# Patient Record
Sex: Male | Born: 1935 | Race: White | Hispanic: No | Marital: Married | Smoking: Former smoker
Health system: Southern US, Community
[De-identification: ages and names within clinical notes are randomized; demographics above are authoritative.]

## PROBLEM LIST (undated history)

## (undated) DIAGNOSIS — K279 Peptic ulcer, site unspecified, unspecified as acute or chronic, without hemorrhage or perforation: Secondary | ICD-10-CM

## (undated) DIAGNOSIS — R911 Solitary pulmonary nodule: Secondary | ICD-10-CM

## (undated) DIAGNOSIS — N529 Male erectile dysfunction, unspecified: Secondary | ICD-10-CM

## (undated) DIAGNOSIS — I2510 Atherosclerotic heart disease of native coronary artery without angina pectoris: Secondary | ICD-10-CM

## (undated) DIAGNOSIS — Z87438 Personal history of other diseases of male genital organs: Secondary | ICD-10-CM

## (undated) DIAGNOSIS — N189 Chronic kidney disease, unspecified: Secondary | ICD-10-CM

## (undated) DIAGNOSIS — I10 Essential (primary) hypertension: Secondary | ICD-10-CM

## (undated) DIAGNOSIS — K219 Gastro-esophageal reflux disease without esophagitis: Secondary | ICD-10-CM

## (undated) DIAGNOSIS — E785 Hyperlipidemia, unspecified: Secondary | ICD-10-CM

## (undated) DIAGNOSIS — I341 Nonrheumatic mitral (valve) prolapse: Secondary | ICD-10-CM

## (undated) DIAGNOSIS — J189 Pneumonia, unspecified organism: Secondary | ICD-10-CM

## (undated) HISTORY — DX: Hyperlipidemia, unspecified: E78.5

## (undated) HISTORY — DX: Atherosclerotic heart disease of native coronary artery without angina pectoris: I25.10

## (undated) HISTORY — DX: Gastro-esophageal reflux disease without esophagitis: K21.9

## (undated) HISTORY — PX: COLONOSCOPY: SHX174

## (undated) HISTORY — DX: Nonrheumatic mitral (valve) prolapse: I34.1

## (undated) HISTORY — DX: Peptic ulcer, site unspecified, unspecified as acute or chronic, without hemorrhage or perforation: K27.9

## (undated) HISTORY — DX: Chronic kidney disease, unspecified: N18.9

## (undated) HISTORY — DX: Male erectile dysfunction, unspecified: N52.9

## (undated) HISTORY — DX: Personal history of other diseases of male genital organs: Z87.438

## (undated) HISTORY — PX: HIP SURGERY: SHX245

## (undated) HISTORY — PX: CORONARY ANGIOPLASTY WITH STENT PLACEMENT: SHX49

## (undated) HISTORY — DX: Solitary pulmonary nodule: R91.1

## (undated) HISTORY — PX: TONSILLECTOMY: SUR1361

---

## 1998-04-28 ENCOUNTER — Other Ambulatory Visit: Admission: RE | Admit: 1998-04-28 | Discharge: 1998-04-28 | Payer: Self-pay | Admitting: Gastroenterology

## 1998-05-18 ENCOUNTER — Ambulatory Visit (HOSPITAL_COMMUNITY): Admission: RE | Admit: 1998-05-18 | Discharge: 1998-05-18 | Payer: Self-pay | Admitting: Gastroenterology

## 2001-06-18 ENCOUNTER — Ambulatory Visit (HOSPITAL_COMMUNITY): Admission: RE | Admit: 2001-06-18 | Discharge: 2001-06-19 | Payer: Self-pay | Admitting: Cardiology

## 2001-07-02 ENCOUNTER — Ambulatory Visit (HOSPITAL_COMMUNITY): Admission: RE | Admit: 2001-07-02 | Discharge: 2001-07-03 | Payer: Self-pay | Admitting: Interventional Cardiology

## 2003-05-25 ENCOUNTER — Ambulatory Visit (HOSPITAL_COMMUNITY): Admission: RE | Admit: 2003-05-25 | Discharge: 2003-05-25 | Payer: Self-pay | Admitting: Gastroenterology

## 2006-12-08 ENCOUNTER — Inpatient Hospital Stay (HOSPITAL_COMMUNITY): Admission: AC | Admit: 2006-12-08 | Discharge: 2006-12-12 | Payer: Self-pay

## 2007-12-22 ENCOUNTER — Encounter: Admission: RE | Admit: 2007-12-22 | Discharge: 2007-12-22 | Payer: Self-pay | Admitting: Family Medicine

## 2008-12-15 ENCOUNTER — Encounter: Admission: RE | Admit: 2008-12-15 | Discharge: 2008-12-15 | Payer: Self-pay | Admitting: Family Medicine

## 2010-05-14 ENCOUNTER — Encounter: Payer: Self-pay | Admitting: Family Medicine

## 2010-09-04 NOTE — Op Note (Signed)
NAMESUHEYB, RAUCCI                 ACCOUNT NO.:  [id number]   MEDICAL RECORD NO.:  [id number]          PATIENT TYPE:  INP   LOCATION:  2550                         FACILITY:  [facility]   PHYSICIAN:  Feliberto Gottron. Turner Daniels, M.D.   DATE OF BIRTH:  15-Jun-1933   DATE OF PROCEDURE:  12/08/2006  DATE OF DISCHARGE:                               OPERATIVE REPORT   PREOPERATIVE DIAGNOSIS:  Traumatic left femoral neck fracture.   POSTOPERATIVE DIAGNOSIS:  Traumatic left femoral neck fracture.   PROCEDURE:  Left hemi-hip arthroplasty using a DePuy #5 Summit basic  stem cemented, 14 tip, #4 cement restricter, NK +0 51 mm monopolar ball.   SURGEON:  Feliberto Gottron. Turner Daniels, M.D.   FIRST ASSISTANT:  Skip Mayer PA-C.   ANESTHETIC:  General endotracheal anesthesia.   ESTIMATED BLOOD LOSS:  300 mL.   FLUID REPLACEMENT:  One Liter crystalloid.   DRAINS PLACED:  None.   TOURNIQUET TIME:  None.   INDICATIONS FOR PROCEDURE:  A 77 year old man who was trimming some  three limbs and fell about 8 feet vertical off of a ladder and sustained  a left femoral neck fracture. When EMS came out to transport him, he was  noted to be hypotensive, gold trauma was activated and he was seen in  the ER by the trauma service and worked up to make sure there were no  other injuries besides left femoral neck fracture and there were not.  Orthopedics was consulted for treatment and we saw him in the emergency  room he did, in fact, have a traumatic left femoral neck fracture.  Risks and benefits of hemi-hip arthroplasty were discussed with this  patient and surgery was consented to.   DESCRIPTION OF PROCEDURE:  The patient identified by armband, taken to  the operating room at [facility]. [facility]  Appropriate  anesthetic monitors were attached and general endotracheal anesthesia  induced with the patient in the supine position.  A Foley catheter was  inserted.  He received 2 grams of Ancef preoperatively, was then  rolled  into the right lateral decubitus position, fixed there with a Stulberg  Mark II pelvic clamp and then the left lower extremity prepped and  draped in the usual sterile fashion from the ankle to the hemi-pelvis.  The skin along the lateral hip and thigh was infiltrated with 20 mL of  0.50% Marcaine and epinephrine solution and a 15 cm incision centered  over the greater trochanter was made allowing a posterolateral approach  to the hip joint.  Small bleeders in the skin and subcutaneous tissue  identified and cauterized.  The IT band was cut in line with the skin  incision exposing fracture hematoma.  A Cobra retractor was placed  between the gluteus minimus and the superior hip joint capsule and a  second Cobra between the quadratus femoris and the inferior hip joint  capsule.  The piriformis and short external rotators were identified and  cut off their insertion on the intertrochanteric crest.  The posterior  capsule was developed into an acetabular based flap and likewise tagged  with two #2 Ethibond sutures.  This exposed the femoral neck fracture.  The femur was flexed and internally rotated exposing the femoral neck  and a standard neck cut was performed one fingerbreadth above the lesser  trochanter.  This allowed extraction of the femoral head with a  corkscrew.  It was measured 51 mm and trial 50 and 51 mm balls were  inserted into the acetabulum.  The 51 had the best fit, fill and  suction.  At this point, the wound was irrigated out with normal saline  solution.  The hip was once again flexed and internally rotated,  exposing the proximal femur which was entered with the initiating  reamer, followed by the lateralizer and then broached up to a #5 broach.  Satisfied with the fit of the broach.  We then milled the calcar down to  a standard fingerbreadth above the lesser trochanter and performed a  trial reduction was an +0 51 mm trial head.  The hip was reduced, could   not be dislocated anteriorly in flexion, it could be internally rotated  almost 80 degrees before there was any instability noted.  At this  point, the trial components were removed.  We sized for a #4 femoral  cement restricter which was inserted at the appropriate depth.  The  proximal femur was then pulse lavaged, dried with suction and sponges  and then a double batch of DePuy 1 cement was mixed with 1500 mg of  Zinacef and inserted in the proximal femur under pressure followed by a  #5 Summit basic stem with a 14 mm tip.  Excess cement was removed as the  cement was allowed to cure and the stem was held in 15-20 degrees of  anteversion.  At this point, an NK +0 51 mm monopolar head was hammered  onto the stem.  The hip again reduced and stability noted to be  excellent.  The wound was once again irrigated out with normal saline  solution.  The capsular flaps and short external rotators were repaired  back to the intertrochanteric crest through drill holes.  The IT band  was closed with running #1 Vicryl suture, the subcutaneous tissue with 0  and 2-0 undyed Vicryl suture and the skin with running interlocking 3-0  nylon suture.  Mepilex was then applied.  The patient was unclamped,  rolled supine, awakened and taken to the recovery room without  difficulty.      Feliberto Gottron. Turner Daniels, M.D.  Electronically Signed     FJR/MEDQ  D:  12/08/2006  T:  12/09/2006  Job:  [id number]

## 2010-09-07 NOTE — Cardiovascular Report (Signed)
[place]  Patient:    Cory Conley, Cory Conley Visit Number: [id number] MRN: [id number]          Service Type: CAT Location: 6500 [id number] [id number] Attending Physician:  Armanda Magic Dictated by:   Darci Needle, M.D. Proc. Date: 06/18/01 Admit Date:  06/18/2001   CC:         Desma Maxim, M.D.  Armanda Magic, M.D.   Cardiac Catheterization  INDICATION FOR PROCEDURE: Subtotally occluded LAD  with abnormal Cardiolite suggesting apical ischemia.  DESCRIPTION OF PROCEDURE: After informed consent, the 6 French sheath previously placed by Dr. Mayford Knife was exchanged using the double glove technique for a 7 French sheath. We then gave 4000 units of IV heparin. A double bolus followed by an infusion of Integrilin was started, and angioplasty on the LAD followed by stenting was performed. This was a quite difficult procedure because of the superior origin of the left main from the left sinus of Valsalva. It also is apparent that the LAD segment in which we were working is perhaps intramyocardial and involves the bifurcation of the intramyocardial LAD with a large septal perforator. We subsequently were successful with a 3.5 cm tip, 7 [place], left Judkins guide catheter. I also needed to use a luge wire rather than a BMW wire.  After successfully crossing the stenosis in the LAD, angioplasty was performed with a CrossSail 15 mm 2.25 mm diameter balloon. After initial angioplasty, we attempted to deploy a 23 mm, long 2.25 mm diameter Pixel, but it would not course into the full segment of the LAD. We then redilated more distally in the LAD, attempting to achieve better guide support, got deeper wire position in the distal LAD, and we were able to deploy the stent to 10 atmospheres, which is 2.3 mm in diameter.  We then post-dilated with a 15 mm, long, 2.5 mm diameter CrossSail to 10 atmospheres, which is 2.56 mm diameter. The stent did not include the ostium of  the LAD beyond the large septal perforator. The patient tolerated the procedure without any significant symptoms suggesting that there are well formed collaterals to the distal LAD bed. Because of a total of 320 cc of contrast used during the LAD procedure, we chose to stage the intervention on the right coronary for another date.  ACT postprocedure 314 seconds.  TIMI grade 3 flow was noted in the LAD after the procedure. Sheaths were sewn in place.  CONCLUSIONS: Successful percutaneous transluminal coronary angioplasty and stent of the proximal (probably intramyocardial left anterior descending with reduction in stenosis from 99% to 0% with an effective diameter of 2.56 mm).  PLAN: Aspirin and Plavix. Integrilin x18 hours. PTCA/stent of the RCA early next week. Dictated by:   Darci Needle, M.D. Attending Physician:  Armanda Magic DD:  06/18/01 TD:  06/18/01 Job: [id number]

## 2010-09-07 NOTE — Cardiovascular Report (Signed)
[place]. [facility]  Patient:    Cory Conley, Cory Conley Visit Number: [id number] MRN: [id number]          Service Type: CAT Location: 6500 6525 01 Attending Physician:  Lyn Records. Iii Dictated by:   Darci Needle, M.D. Proc. Date: 07/02/01 Admit Date:  07/02/2001   CC:         Armanda Magic, M.D.  Desma Maxim, M.D.   Cardiac Catheterization  INDICATION FOR PROCEDURE: High-grade mid right coronary stenosis beyond shepherds crook.  PROCEDURES PERFORMED: 1. Left heart catheterization. 2. Left coronary angiography. 3. Stent, right coronary artery.  DESCRIPTION OF PROCEDURE: After informed consent, a 6 French sheath was placed in the right femoral artery using the modified Seldinger technique.  A 6 French #4 left Judkins catheter was used for left coronary angiography. After documenting wide patency of the LAD stent, we turned our attention to the right coronary. We removed the diagnostic left Judkins catheter and obtained guiding shots initially with a side-hole, hockey-stick, 6 Jamaica guide catheter. Despite the appearance that it might provide good support, this guide catheter would only partially enter the horizontal segment of the proximal right coronary and therefore there was not good guide support. We subsequently switched to a 6 Jamaica, shepherds crook configuration guide catheter which provided much better coaxial support. The BMW wire was passed beyond the lesion into the distal right coronary and we attempted to directly stent the lesion. However, the stent would not traverse around the shepherds crook without backing out the guide catheter. We then placed a second BMW wire which further straightened the shepherds crook and made it easy for the stent to be positioned distal to the shepherds crook. Two balloon inflations were performed with a 3.0 mm diameter, 13 mm long, Express II stent. Affective diameter 3.4 mm.  The patient  tolerated the procedure. He had angina with each episode of balloon inflations. Final angiographic results were adequate. TIMI grade 3 flow was present postprocedure. ACT was 260 seconds.  CONCLUSIONS: 1. Continued wide patency of the stent in the left anterior descending. 2. Successful direct stent in the right coronary beyond the shepherds crook    in the proximal vessel with reduction in stenosis from 80% to 0%.  PLAN: Aspirin, Plavix, Integrilin. Discharge July 03, 2001. Dictated by:   Darci Needle, M.D. Attending Physician:  Lyn Records. Iii DD:  07/02/01 TD:  07/03/01 Job: [id number]

## 2010-09-07 NOTE — Cardiovascular Report (Signed)
[place]  Patient:    Cory Conley, Cory Conley Visit Number: [id number] MRN: [id number]          Service Type: CAT Location: [facility] 2854 01 Attending Physician:  Armanda Magic Dictated by:   Armanda Magic, M.D. Proc. Date: 06/18/01 Admit Date:  06/18/2001   CC:         Desma Maxim, M.D.   Cardiac Catheterization  REFERRING PHYSICIAN:  Desma Maxim, M.D.  PROCEDURE:  Left heart catheterization, coronary angiography, left ventriculography.  OPERATOR:  Armanda Magic, M.D.  INDICATIONS:  Chest pain and abnormal cardiolite.  COMPLICATIONS:  None.  IV ACCESS:  Via the right femoral artery with a 6-French sheath.  HISTORY OF PRESENT ILLNESS:  This is a 77 year old white male who presented with substernal chest pain and was found to have an abnormal cardiolite with an apical defect.  The patient now presents for cardiac catheterization.  DESCRIPTION OF PROCEDURE:  The patient was brought to the cardiac catheterization laboratory in the fasting nonsedated state.  Informed consent was obtained.  The patient was connected to continuous heart rate and pulse oximetry monitoring and intermittent blood pressure monitoring.  The right groin was prepped and draped in the sterile fashion.  One percent Xylocaine was used for local anesthesia.  Using the modified Seldinger technique, a 6-French sheath was placed in the right femoral artery.  Under fluoroscopic guidance, a 6-French JL-4 catheter was placed in the left coronary artery. Multiple cine films were taken in 30 degree RAO and 40 degree LAO views.  This catheter was then exchanged out for a guide wire for a 6-French JL-4 catheter which was placed under fluoroscopic guidance into the left coronary artery. Multiple cine films were taken in 30 degree RAO and 40 degree LAO views.  This catheter was then exchanged out over a guide wire for a 6-French angled pigtail catheter which was placed in the left  ventricular cavity.  Left ventriculography was performed in a 30 degree RAO view using a total of 30 cc of contrast at 13 cc/second, using a total of 24 cc of contrast at 12 cc/second.  The catheter was then pulled back across the aortic valve and then the catheter was removed.  At the end of the procedure, the sheath was sutured in place and the patient was given 3000 units of heparin, transferred to the holding room for review of films by Dr. Katrinka Blazing for possible intervention.  RESULTS: 1.  Left main coronary artery is widely patent and bifurcates into a left     anterior descending artery and left circumflex artery.  The posterior     descending artery does come off the distal left circumflex and is widely     patent. 2.  The left circumflex artery gives rise to one very large branching obtuse     marginal branch.  There is a 20 percent ostial narrowing in the obtuse     marginal branch and then the OM bifurcates into two daughter vessels.     In one of the daughter vessels, there is a 50 percent proximal narrowing.     The rest of the circumflex is widely patent throughout its course.  The     left anterior descending artery gives rise to a high first diagonal which     is widely patent.  It traverses near to the apex. 3.  The left anterior descending artery gives rise to a septal perforator, and  then has a 90 percent narrowing in the proximal to mid portion.  The     vessel extends to the apex and is widely patent the rest of the way     through the vessel. 4.  The right coronary artery has a 70-80 percent mid narrowing prior and     then is patent.  Distally, it terminates in the posterolateral artery.  LEFT VENTRICULOGRAPHY:  Performed at 30 degree RAO view using a total of 24 cc of contrast at 12 cc/second showed a left ventricular pressure of 148/7 mmHg, aortic pressure 150/81 mmHg, normal LV function, no regional wall motion abnormalities.  ASSESSMENT: 1. Two vessel  obstructive coronary disease. 2. Normal LV function.  PLAN:  Review of films by Dr. Katrinka Blazing for possible percutaneous transluminal coronary intervention with a staged procedure of the LAD and RCA. Dictated by:   Armanda Magic, M.D. Attending Physician:  Armanda Magic DD:  06/18/01 TD:  06/18/01 Job: [id number]

## 2010-09-07 NOTE — Discharge Summary (Signed)
Cory Conley, Cory Conley                 ACCOUNT NO.:  [id number]   MEDICAL RECORD NO.:  [id number]          PATIENT TYPE:  INP   LOCATION:  5007                         FACILITY:  [facility]   PHYSICIAN:  Feliberto Gottron. Turner Daniels, M.D.   DATE OF BIRTH:  08-Jun-1933   DATE OF ADMISSION:  12/08/2006  DATE OF DISCHARGE:  12/12/2006                               DISCHARGE SUMMARY   DIAGNOSIS:  Left hip fracture.   DISCHARGE SUMMARY:  Patient is a 77 year old community ambulator who was  trimming some tree limbs and fell about eight feet vertically off a  ladder sustaining a left femoral neck fracture on the date of admission.  EMS came to transport him.  He was noted to be hypotensive.  Gold trauma  was activated.  He was seen in the ED by the trauma service and worked  up to make sure there were no other injuries besides his left femoral  neck fracture and there were none apparent.  Orthopedics was consulted  for treatment and he was seen in the emergency room for his traumatic  left femoral neck fracture.  Risks and benefits of hemiarthroplasty were  discussed and the patient consented to the procedure.  He last ate at  approximately 8:00 a.m. prior to the procedure.  No reported loss of  consciousness.   PAST MEDICAL HISTORY:  For:  1. Coronary artery disease.  2. Hypertension.  3. Gastrointestinal reflux disease; last saw Dr. Mayford Knife in June of      2008.   SURGICAL HISTORY:  Cardiac stents, left neck mass, no difficulty with  GET.   FAMILY HISTORY:  Mother died at age 63 with history of CAD and CVA;  father died at 75 with a history of MI.   SOCIAL HISTORY:  No tobacco, no ethanol, no IV drug abuse.  He is  married.   DRUG ALLERGIES:  NIACIN.   MEDICATIONS AT TIME OF ADMISSION:  1. Altace 10 mg q.d.  2. Vytorin 10/20 q.d.  3. Aspirin 325 q.d.  4. Flomax one q.h.s.  5. Prilosec one q.a.m.   REVIEW OF SYSTEMS:  Unremarkable.  Patient denies any recent illness,  chest pain or shortness of  breath.   PHYSICAL EXAMINATION:  GENERAL:  Patient is 5 feet 11 inches, 198-pound  male.  VITAL SIGNS:  Temperature is 96.6, his pulse is 66, respirations 16,  blood pressure 123/67.  HEAD:  Normocephalic, atraumatic.  He does have partial upper dentures.  EYES:  Pupils equal and round to light and accommodation.  EARS:  TMs are clear.  NECK:  Supple with full range of motion.  CHEST:  Clear to auscultation and percussion.  HEART:  Regular rate and rhythm.  ABDOMEN:  Soft, nontender.  Bowel sounds 2+.  PELVIC:  Is not performed.  GU:  Is not performed.  EXTREMITIES:  Show left lower extremity to be short and rotated with  positive pain with any attempts with range of motion.  Neurovascularly  intact.  SKIN:  Intact.   Preoperative labs including CBC, C-Met, chest x-ray, EKG, PT and PTT are  within normal limits with exception of x-ray which shows a T wave  abnormality and an incomplete left bundle-branch block.   HOSPITAL COURSE:  On the date of admission, the patient was taken to the  operating room where he underwent a left hemi-hip arthroplasty using a  DePuy #5 Summit basic stem cemented 14 tip, #4 cement restricter and a  +0, 51 mm all poly ball.  Patient was placed on perioperative  antibiotics.  He was placed on postoperative Coumadin prophylaxis with a  target INR of 1.5 to 2.  He was placed on bridging Lovenox until it  became therapeutic.  He was placed on PCA Dilaudid pain pump for pain  control and physical therapy was begun the first postoperative day.  Postop day one, the patient was awake and alert.  No nausea or vomiting.  Vital signs were stable.  Wound had scant drain through, no sign of  infection.  X-ray showed well-placed well-fixed hemi-hip.  Hemoglobin  12.4, PT 15.5 and otherwise stable.  Postop day two, patient complained  of moderate pain.  He was afebrile with a hemoglobin of 9.6, INR 1.8.  Dressing was stable, wound was benign.  PCA was discontinued.   Physical  therapy was discontinued.  Postoperative day three, patient was without  complaint.  He was out of bed to hall with moderate assistance.  Hemoglobin 9.6, INR 1.9.  He was afebrile.  Wound was clean and dry.  Otherwise, making progress in physical therapy.  There was some thought  that he might not be progressing quickly enough to go home __________ ,  but was hoping he would be able to go directly home, as he was starting  to make better progress.  Postop day four, the patient was without  complaint.  He was afebrile at 101.9.  Dressing was dry and was benign.  Calf was soft.  He was medically stable and orthopedically improved.  He  was discharged home after physical therapy goals were met.   MEDICATIONS AT TIME OF DISCHARGE:  Tylox and Coumadin for two weeks per  pharmacy protocol.  Home med rec sheet was signed.   He can be weightbearing as tolerated with walker and total hip  precautions; dressing changes q.i.d.; may shower postoperative day  number seven; diet is regular; return to clinic one week's time for  suture removal and wound check.      Laural Benes. Jannet Mantis.      Feliberto Gottron. Turner Daniels, M.D.  Electronically Signed    JBR/MEDQ  D:  02/17/2007  T:  02/17/2007  Job:  [id number]

## 2010-09-07 NOTE — Op Note (Signed)
NAMEDURELL, LOFASO                           ACCOUNT NO.:  [id number]   MEDICAL RECORD NO.:  [id number]                   PATIENT TYPE:  AMB   LOCATION:  ENDO                                 FACILITY:  [facility]   PHYSICIAN:  Danise Edge, M.D.                DATE OF BIRTH:  18-Jul-1933   DATE OF PROCEDURE:  05/25/2003  DATE OF DISCHARGE:                                 OPERATIVE REPORT   PROCEDURE:  Surveillance colonoscopy with polypectomy to prevent colon  cancer.   PROCEDURE INDICATION:  Mr. Cory Conley is a 77 year old male born  10/03/1933.  Approximately five years ago Cory Conley underwent a  colonoscopy to remove neoplastic but noncancerous colon polyps.  He is  scheduled to undergo a repeat colonoscopy with polypectomy to prevent colon  cancer.   ENDOSCOPIST:  Danise Edge, M.D.   PREMEDICATION:  Versed 5 mg, Demerol 50 mg.   DESCRIPTION OF PROCEDURE:  After obtaining informed consent, Mr. Cory Conley was  placed in the left lateral decubitus position.  I administered intravenous  Demerol and intravenous Versed to achieve conscious sedation for the  procedure.  The patient's blood pressure, oxygen saturation, and cardiac  rhythm were monitored throughout the procedure and documented in the medical  record.   Anal inspection was normal.  Digital rectal exam revealed an enlarged but  non-nodular prostate.  The Olympus adjustable pediatric colonoscope was  introduced into the rectum and easily advanced to the cecum.  Colonic  preparation for the exam today was excellent.   Rectum normal.   Sigmoid colon and descending colon normal.   Splenic flexure normal.   Transverse colon normal.   Hepatic flexure normal.   Ascending colon normal.   Cecum and ileocecal valve normal.   ASSESSMENT:  Normal surveillance proctocolonoscopy to the cecum.  No  endoscopic evidence for the presence of recurrent colorectal neoplasia.   RECOMMENDATIONS:  Repeat colorectal  cancer-polyp screen in approximately  five years.                                               Danise Edge, M.D.    MJ/MEDQ  D:  05/25/2003  T:  05/25/2003  Job:  [id number]   cc:   Donia Guiles, M.D.  [address] [place]  [place] [address]  Fax: [phone]

## 2011-02-01 LAB — CBC
HCT: 39.4
MCHC: 34.3
MCHC: 34.9
MCV: 88.8
MCV: 90.9
Platelets: 159
Platelets: 164
Platelets: 206
Platelets: 225
RBC: 3.08 — ABNORMAL LOW
RBC: 4.44
RDW: 13.8
RDW: 14.1 — ABNORMAL HIGH
RDW: 14.4 — ABNORMAL HIGH
RDW: 14.5 — ABNORMAL HIGH
WBC: 4.8
WBC: 8.1
WBC: 9.9

## 2011-02-01 LAB — PROTIME-INR
INR: 1.8 — ABNORMAL HIGH
INR: 1.9 — ABNORMAL HIGH
Prothrombin Time: 14.3
Prothrombin Time: 15.1
Prothrombin Time: 21.5 — ABNORMAL HIGH
Prothrombin Time: 22.1 — ABNORMAL HIGH
Prothrombin Time: 22.3 — ABNORMAL HIGH

## 2011-02-01 LAB — POCT I-STAT CREATININE
Creatinine, Ser: 1.5
Operator id: 294501

## 2011-02-01 LAB — I-STAT 8, (EC8 V) (CONVERTED LAB)
Bicarbonate: 25.8 — ABNORMAL HIGH
HCT: 41
Potassium: 3.8
TCO2: 27
pH, Ven: 7.366 — ABNORMAL HIGH

## 2012-09-28 ENCOUNTER — Emergency Department (HOSPITAL_COMMUNITY)
Admission: EM | Admit: 2012-09-28 | Discharge: 2012-09-28 | Disposition: A | Discharge status: Home / Self Care | Payer: Medicare Other | Attending: Emergency Medicine | Admitting: Emergency Medicine

## 2012-09-28 ENCOUNTER — Encounter (HOSPITAL_COMMUNITY): Payer: Self-pay | Admitting: Emergency Medicine

## 2012-09-28 ENCOUNTER — Emergency Department (HOSPITAL_COMMUNITY): Payer: Medicare Other

## 2012-09-28 DIAGNOSIS — R05 Cough: Secondary | ICD-10-CM | POA: Insufficient documentation

## 2012-09-28 DIAGNOSIS — Z87891 Personal history of nicotine dependence: Secondary | ICD-10-CM | POA: Insufficient documentation

## 2012-09-28 DIAGNOSIS — Z7982 Long term (current) use of aspirin: Secondary | ICD-10-CM | POA: Insufficient documentation

## 2012-09-28 DIAGNOSIS — Z79899 Other long term (current) drug therapy: Secondary | ICD-10-CM | POA: Insufficient documentation

## 2012-09-28 DIAGNOSIS — R5381 Other malaise: Secondary | ICD-10-CM | POA: Insufficient documentation

## 2012-09-28 DIAGNOSIS — I10 Essential (primary) hypertension: Secondary | ICD-10-CM | POA: Insufficient documentation

## 2012-09-28 DIAGNOSIS — J189 Pneumonia, unspecified organism: Secondary | ICD-10-CM

## 2012-09-28 DIAGNOSIS — R059 Cough, unspecified: Secondary | ICD-10-CM | POA: Insufficient documentation

## 2012-09-28 DIAGNOSIS — J159 Unspecified bacterial pneumonia: Secondary | ICD-10-CM | POA: Insufficient documentation

## 2012-09-28 HISTORY — DX: Essential (primary) hypertension: I10

## 2012-09-28 LAB — COMPREHENSIVE METABOLIC PANEL
ALT: 16 U/L (ref 0–53)
AST: 20 U/L (ref 0–37)
Alkaline Phosphatase: 90 U/L (ref 39–117)
CO2: 24 mEq/L (ref 19–32)
Calcium: 9.3 mg/dL (ref 8.4–10.5)
GFR calc Af Amer: 49 mL/min — ABNORMAL LOW (ref >90)
Glucose, Bld: 147 mg/dL — ABNORMAL HIGH (ref 70–99)
Potassium: 3.8 mEq/L (ref 3.5–5.1)
Sodium: 136 mEq/L (ref 135–145)
Total Protein: 7 g/dL (ref 6.0–8.3)

## 2012-09-28 LAB — CBC WITH DIFFERENTIAL/PLATELET
Basophils Absolute: 0 10*3/uL (ref 0.0–0.1)
Eosinophils Absolute: 0.1 10*3/uL (ref 0.0–0.7)
Lymphocytes Relative: 5 % — ABNORMAL LOW (ref 12–46)
Lymphs Abs: 0.5 10*3/uL — ABNORMAL LOW (ref 0.7–4.0)
Neutrophils Relative %: 83 % — ABNORMAL HIGH (ref 43–77)
Platelets: 167 10*3/uL (ref 150–400)
RBC: 4.23 MIL/uL (ref 4.22–5.81)
RDW: 13.7 % (ref 11.5–15.5)
WBC: 10.1 10*3/uL (ref 4.0–10.5)

## 2012-09-28 MED ORDER — ACETAMINOPHEN 325 MG PO TABS
650.0000 mg | ORAL_TABLET | Freq: Once | ORAL | Status: AC
  Administered 2012-09-28: 650 mg via ORAL
  Filled 2012-09-28: qty 2

## 2012-09-28 MED ORDER — LEVOFLOXACIN 500 MG PO TABS: 500.0000 mg | ORAL_TABLET | Freq: Every day | ORAL | Status: DC

## 2012-09-28 MED ORDER — LEVOFLOXACIN IN D5W 500 MG/100ML IV SOLN
500.0000 mg | Freq: Once | INTRAVENOUS | Status: AC
  Administered 2012-09-28: 500 mg via INTRAVENOUS
  Filled 2012-09-28: qty 100

## 2012-09-28 NOTE — ED Notes (Signed)
Patient transported to X-ray 

## 2012-09-28 NOTE — ED Notes (Signed)
Pt. Reports fever with chills and occasional dry cough , denies chest pain or sob .

## 2012-09-29 NOTE — ED Provider Notes (Signed)
History     CSN: [id number]  Arrival date & time 09/28/12  2011   First MD Initiated Contact with Patient 09/28/12 2119      Chief Complaint  Patient presents with  . Fever    (Consider location/radiation/quality/duration/timing/severity/associated sxs/prior treatment) HPI Pt with several days of fatigue and dry cough. +chills and fever today. No CP, SOB, wheezing. No N/V/D, urinary symptoms. No lower ext swelling or pain. No sick contact, recent hospitalizations.  Past Medical History  Diagnosis Date  . Hypertension     Past Surgical History  Procedure Laterality Date  . Hip surgery      No family history on file.  History  Substance Use Topics  . Smoking status: Former Games developer  . Smokeless tobacco: Not on file  . Alcohol Use: No      Review of Systems  Constitutional: Positive for fever, chills and fatigue.  HENT: Negative for congestion, sore throat, neck pain, neck stiffness and sinus pressure.   Respiratory: Positive for cough. Negative for chest tightness, shortness of breath and wheezing.   Cardiovascular: Negative for chest pain, palpitations and leg swelling.  Gastrointestinal: Negative for nausea, vomiting, abdominal pain, diarrhea and constipation.  Genitourinary: Negative for dysuria, frequency and flank pain.  Musculoskeletal: Negative for myalgias and back pain.  Skin: Negative for pallor, rash and wound.  Neurological: Negative for dizziness, syncope, weakness, numbness and headaches.  All other systems reviewed and are negative.    Allergies  Niacin and related  Home Medications   Current Outpatient Rx  Name  Route  Sig  Dispense  Refill  . amLODipine (NORVASC) 10 MG tablet   Oral   Take 10 mg by mouth daily.         Marland Kitchen aspirin 325 MG tablet   Oral   Take 325 mg by mouth daily.         Marland Kitchen ibuprofen (ADVIL,MOTRIN) 200 MG tablet   Oral   Take 400 mg by mouth every 6 (six) hours as needed for pain or fever.          . Multiple  Vitamin (MULTIVITAMIN WITH MINERALS) TABS   Oral   Take 1 tablet by mouth daily.         Marland Kitchen omeprazole (PRILOSEC) 20 MG capsule   Oral   Take 20 mg by mouth daily.         . ramipril (ALTACE) 5 MG tablet   Oral   Take 5 mg by mouth daily.         . silodosin (RAPAFLO) 8 MG CAPS capsule   Oral   Take 8 mg by mouth daily with breakfast.         . levofloxacin (LEVAQUIN) 500 MG tablet   Oral   Take 1 tablet (500 mg total) by mouth daily.   10 tablet   0     BP 134/62  Pulse 73  Temp(Src) 100.3 F (37.9 C) (Oral)  Resp 18  SpO2 100%  Physical Exam  Nursing note and vitals reviewed. Constitutional: He is oriented to person, place, and time. He appears well-developed and well-nourished. No distress.  Very well appearing  HENT:  Head: Normocephalic and atraumatic.  Mouth/Throat: Oropharynx is clear and moist. No oropharyngeal exudate.  Eyes: EOM are normal. Pupils are equal, round, and reactive to light.  Neck: Normal range of motion. Neck supple.  No meningismus   Cardiovascular: Normal rate and regular rhythm.  Exam reveals no gallop and no friction rub.  No murmur heard. Pulmonary/Chest: Effort normal. No respiratory distress. He has no wheezes. He has rales. He exhibits no tenderness.  R midlung rales  Abdominal: Soft. Bowel sounds are normal. He exhibits no distension and no mass. There is no tenderness. There is no rebound and no guarding.  Musculoskeletal: Normal range of motion. He exhibits no edema and no tenderness.  No calf tenderness or swelling  Neurological: He is alert and oriented to person, place, and time.  5/5 motor in all ext, sensation intact  Skin: Skin is warm and dry. No rash noted. No erythema.  Psychiatric: He has a normal mood and affect. His behavior is normal.    ED Course  Procedures (including critical care time)  Labs Reviewed  CBC WITH DIFFERENTIAL - Abnormal; Notable for the following:    Hemoglobin 12.7 (*)    HCT 36.9  (*)    Neutrophils Relative % 83 (*)    Neutro Abs 8.4 (*)    Lymphocytes Relative 5 (*)    Lymphs Abs 0.5 (*)    Monocytes Absolute 1.1 (*)    All other components within normal limits  COMPREHENSIVE METABOLIC PANEL - Abnormal; Notable for the following:    Glucose, Bld 147 (*)    Creatinine, Ser 1.53 (*)    GFR calc non Af Amer 42 (*)    GFR calc Af Amer 49 (*)    All other components within normal limits  CG4 I-STAT (LACTIC ACID)   Dg Chest 2 View  09/28/2012   *RADIOLOGY REPORT*  Clinical Data: Fever, chills, and cough since Friday night.  CHEST - 2 VIEW  Comparison: 12/15/2008  Findings: Wedge-shaped area of consolidation in the superior segment of the right lower lobe is new since previous study.  Given the acute symptoms, this is likely represent pneumonia.  However a mass lesion is not excluded and follow up after resolution of acute symptoms is recommended.  Central bronchiectasis is suggested bilaterally.  Normal heart size and pulmonary vascularity.  No blunting of costophrenic angles.  No pneumothorax.  Mediastinal contours appear intact.  Tortuous aorta.  Degenerative changes in the spine.  Mild wedge deformities of mid and lower thoracic vertebra.  IMPRESSION: Focal consolidation in the superior segment right lower lung probably represents pneumonia.  Follow up after resolution of acute symptoms is recommended to exclude mass lesion.   Original Report Authenticated By: Burman Nieves, M.D.     1. Community acquired pneumonia       MDM  Pt is well appearing with normal VS. States he would like to be d/c'd home. Given strict return precautions. Pt voiced understanding. Will f/u with PMD in several days to assure resolution of pneumonia         Loren Racer, MD 09/29/12 1610

## 2012-10-15 ENCOUNTER — Other Ambulatory Visit: Payer: Self-pay | Admitting: Physician Assistant

## 2012-10-15 ENCOUNTER — Ambulatory Visit
Admission: RE | Admit: 2012-10-15 | Discharge: 2012-10-15 | Disposition: A | Discharge status: Home / Self Care | Payer: Medicare Other | Source: Ambulatory Visit | Attending: Physician Assistant | Admitting: Physician Assistant

## 2012-10-15 DIAGNOSIS — J111 Influenza due to unidentified influenza virus with other respiratory manifestations: Secondary | ICD-10-CM

## 2012-10-16 ENCOUNTER — Other Ambulatory Visit: Payer: Self-pay | Admitting: Family Medicine

## 2012-10-16 DIAGNOSIS — J189 Pneumonia, unspecified organism: Secondary | ICD-10-CM

## 2012-10-22 ENCOUNTER — Ambulatory Visit
Admission: RE | Admit: 2012-10-22 | Discharge: 2012-10-22 | Disposition: A | Discharge status: Home / Self Care | Payer: Medicare Other | Source: Ambulatory Visit | Attending: Family Medicine | Admitting: Family Medicine

## 2012-10-22 DIAGNOSIS — J189 Pneumonia, unspecified organism: Secondary | ICD-10-CM

## 2012-10-22 MED ORDER — IOHEXOL 300 MG/ML  SOLN
50.0000 mL | Freq: Once | INTRAMUSCULAR | Status: AC | PRN
  Administered 2012-10-22: 50 mL via INTRAVENOUS

## 2012-12-10 ENCOUNTER — Other Ambulatory Visit: Payer: Self-pay | Admitting: Family Medicine

## 2012-12-10 ENCOUNTER — Ambulatory Visit
Admission: RE | Admit: 2012-12-10 | Discharge: 2012-12-10 | Disposition: A | Discharge status: Home / Self Care | Payer: Medicare Other | Source: Ambulatory Visit | Attending: Family Medicine | Admitting: Family Medicine

## 2012-12-10 DIAGNOSIS — M25579 Pain in unspecified ankle and joints of unspecified foot: Secondary | ICD-10-CM

## 2013-01-16 ENCOUNTER — Other Ambulatory Visit: Payer: Self-pay | Admitting: Cardiology

## 2013-01-16 DIAGNOSIS — E782 Mixed hyperlipidemia: Secondary | ICD-10-CM

## 2013-01-16 DIAGNOSIS — Z79899 Other long term (current) drug therapy: Secondary | ICD-10-CM

## 2013-02-16 ENCOUNTER — Other Ambulatory Visit: Payer: Medicare Other

## 2013-05-06 ENCOUNTER — Other Ambulatory Visit: Payer: Self-pay | Admitting: Family Medicine

## 2013-05-06 DIAGNOSIS — R911 Solitary pulmonary nodule: Secondary | ICD-10-CM

## 2013-05-11 ENCOUNTER — Ambulatory Visit
Admission: RE | Admit: 2013-05-11 | Discharge: 2013-05-11 | Disposition: A | Discharge status: Home / Self Care | Payer: Medicare Other | Source: Ambulatory Visit | Attending: Family Medicine | Admitting: Family Medicine

## 2013-05-11 DIAGNOSIS — R911 Solitary pulmonary nodule: Secondary | ICD-10-CM

## 2013-08-09 ENCOUNTER — Other Ambulatory Visit: Payer: Self-pay | Admitting: Cardiology

## 2013-08-18 ENCOUNTER — Encounter: Payer: Self-pay | Admitting: General Surgery

## 2013-08-31 ENCOUNTER — Encounter (INDEPENDENT_AMBULATORY_CARE_PROVIDER_SITE_OTHER): Payer: Self-pay

## 2013-08-31 ENCOUNTER — Ambulatory Visit (INDEPENDENT_AMBULATORY_CARE_PROVIDER_SITE_OTHER): Payer: Medicare Other | Admitting: Cardiology

## 2013-08-31 ENCOUNTER — Encounter: Payer: Self-pay | Admitting: Cardiology

## 2013-08-31 VITALS — BP 138/74 | HR 52 | Ht 68.0 in | Wt 189.0 lb

## 2013-08-31 DIAGNOSIS — I498 Other specified cardiac arrhythmias: Secondary | ICD-10-CM

## 2013-08-31 DIAGNOSIS — E785 Hyperlipidemia, unspecified: Secondary | ICD-10-CM

## 2013-08-31 DIAGNOSIS — I2510 Atherosclerotic heart disease of native coronary artery without angina pectoris: Secondary | ICD-10-CM

## 2013-08-31 DIAGNOSIS — I10 Essential (primary) hypertension: Secondary | ICD-10-CM

## 2013-08-31 DIAGNOSIS — R001 Bradycardia, unspecified: Secondary | ICD-10-CM | POA: Insufficient documentation

## 2013-08-31 LAB — LIPID PANEL
CHOL/HDL RATIO: 4
Cholesterol: 118 mg/dL (ref 0–200)
HDL: 31.6 mg/dL — AB (ref >39.00)
LDL Cholesterol: 71 mg/dL (ref 0–99)
Triglycerides: 77 mg/dL (ref 0.0–149.0)
VLDL: 15.4 mg/dL (ref 0.0–40.0)

## 2013-08-31 LAB — ALT: ALT: 23 U/L (ref 0–53)

## 2013-08-31 MED ORDER — ASPIRIN 81 MG PO TABS: 81.0000 mg | ORAL_TABLET | Freq: Every day | ORAL | Status: AC

## 2013-08-31 NOTE — Progress Notes (Signed)
[facility], [place], [place]  [address] Phone: [phone] Fax:  [phone]  Date:  08/31/2013   ID:  Cory Conley, DOB [birth year], MRN [id number]  PCP:  Mayra Neer, MD  Cardiologist:  Fransico Him, MD     History of Present Illness: Cory Conley is a 80 y.o. male with a history of ASCAD s/p PCI x 2 in 2003, dyslipdemia and HTN who presents today for followup.  He is doing well.  He denies any chest pain, SOB, DOE, LE edema, dizziness, palpitations or syncope.   Wt Readings from Last 3 Encounters:  No data found for Wt     Past Medical History  Diagnosis Date  . Dyslipidemia   . History of BPH   . GERD (gastroesophageal reflux disease)   . Peptic ulcer disease   . ED (erectile dysfunction)   . Pulmonary nodule, right     RUL 5 mm-repeat CT 03/2013  . Chronic kidney disease     stage 3  . Hypertension   . Coronary artery disease     post stent X2 in 2003    Current Outpatient Prescriptions  Medication Sig Dispense Refill  . amLODipine (NORVASC) 10 MG tablet Take 10 mg by mouth daily.      Marland Kitchen aspirin 325 MG tablet Take 325 mg by mouth daily.      Marland Kitchen atorvastatin (LIPITOR) 10 MG tablet TAKE 1 TABLET BY MOUTH ONCE A DAY  30 tablet  0  . Coenzyme Q10 (COQ10) 200 MG CAPS Take 200 mg by mouth daily.      Marland Kitchen ibuprofen (ADVIL,MOTRIN) 200 MG tablet Take 400 mg by mouth every 6 (six) hours as needed for pain or fever.       . Multiple Vitamin (MULTIVITAMIN WITH MINERALS) TABS Take 1 tablet by mouth daily.      Marland Kitchen omeprazole (PRILOSEC) 20 MG capsule Take 20 mg by mouth daily.      . ramipril (ALTACE) 5 MG tablet Take 5 mg by mouth daily.      . silodosin (RAPAFLO) 8 MG CAPS capsule Take 8 mg by mouth daily with breakfast.       No current facility-administered medications for this visit.    Allergies:    Allergies  Allergen Reactions  . Crestor [Rosuvastatin]     Myalgia  . Lipitor [Atorvastatin]     10 MG causes aches  . Niacin And Related     unknown    . Simvastatin     Myalgia   . Vytorin [Ezetimibe-Simvastatin]     Myalgia    Social History:  The patient  reports that he has quit smoking. His smoking use included Cigarettes. He smoked 0.00 packs per day. He does not have any smokeless tobacco history on file. He reports that he does not drink alcohol or use illicit drugs.   Family History:  The patient's family history includes CVA in his mother; Peripheral vascular disease in his father.   ROS:  Please see the history of present illness.      All other systems reviewed and negative.   PHYSICAL EXAM: VS:  There were no vitals taken for this visit. Well nourished, well developed, in no acute distress HEENT: normal Neck: no JVD Cardiac:  normal S1, S2; RRR; no murmur Lungs:  clear to auscultation bilaterally, no wheezing, rhonchi or rales Abd: soft, nontender, no hepatomegaly Ext: no edema Skin: warm and dry Neuro:  CNs 2-12 intact, no focal  abnormalities noted   EKG:  Sinus bradycardia at 50bpm with nonspecific ST abnormality    ASSESSMENT AND PLAN:  1. ASCAD s/p remote PCI with no angina - decrease ASA 81mg  daily 2. Dyslipidemia - check fasting lipids and ALT - continue atorvastatin 3. HTN well controlled - continue amlodipine/ramipril 4.  Asymptomatic bradycardia  Followup with me in 1 year  Signed, Fransico Him, MD 08/31/2013 8:49 AM

## 2013-08-31 NOTE — Patient Instructions (Addendum)
Your physician has recommended you make the following change in your medication:   1. Decrease your Aspirin to 81 MG 1 tablet daily  Your physician recommends that you go to the lab today for a Lipid and ALT Panel  Your physician wants you to follow-up in: 1 Year with Dr Mallie Snooks will receive a reminder letter in the mail two months in advance. If you don't receive a letter, please call our office to schedule the follow-up appointment.

## 2013-09-02 ENCOUNTER — Other Ambulatory Visit: Payer: Self-pay | Admitting: General Surgery

## 2013-09-02 ENCOUNTER — Encounter: Payer: Self-pay | Admitting: General Surgery

## 2013-09-02 DIAGNOSIS — E785 Hyperlipidemia, unspecified: Secondary | ICD-10-CM

## 2013-09-10 ENCOUNTER — Other Ambulatory Visit: Payer: Self-pay | Admitting: Cardiology

## 2014-01-19 ENCOUNTER — Other Ambulatory Visit: Payer: Self-pay | Admitting: Gastroenterology

## 2014-03-10 ENCOUNTER — Other Ambulatory Visit: Payer: Self-pay | Admitting: Cardiology

## 2014-04-06 ENCOUNTER — Encounter (HOSPITAL_COMMUNITY): Payer: Self-pay | Admitting: *Deleted

## 2014-04-18 ENCOUNTER — Other Ambulatory Visit: Payer: Self-pay | Admitting: Gastroenterology

## 2014-04-26 ENCOUNTER — Ambulatory Visit (HOSPITAL_COMMUNITY)
Admission: RE | Admit: 2014-04-26 | Discharge: 2014-04-26 | Disposition: A | Discharge status: Home / Self Care | Payer: Medicare Other | Source: Ambulatory Visit | Attending: Gastroenterology | Admitting: Gastroenterology

## 2014-04-26 ENCOUNTER — Encounter (HOSPITAL_COMMUNITY)
Admission: RE | Disposition: A | Discharge status: Home / Self Care | Payer: Self-pay | Source: Ambulatory Visit | Attending: Gastroenterology

## 2014-04-26 ENCOUNTER — Encounter (HOSPITAL_COMMUNITY): Payer: Self-pay | Admitting: Anesthesiology

## 2014-04-26 ENCOUNTER — Ambulatory Visit (HOSPITAL_COMMUNITY): Payer: Medicare Other | Admitting: Anesthesiology

## 2014-04-26 DIAGNOSIS — N183 Chronic kidney disease, stage 3 (moderate): Secondary | ICD-10-CM | POA: Diagnosis not present

## 2014-04-26 DIAGNOSIS — Z87891 Personal history of nicotine dependence: Secondary | ICD-10-CM | POA: Insufficient documentation

## 2014-04-26 DIAGNOSIS — I2510 Atherosclerotic heart disease of native coronary artery without angina pectoris: Secondary | ICD-10-CM | POA: Insufficient documentation

## 2014-04-26 DIAGNOSIS — Z888 Allergy status to other drugs, medicaments and biological substances status: Secondary | ICD-10-CM | POA: Diagnosis not present

## 2014-04-26 DIAGNOSIS — N289 Disorder of kidney and ureter, unspecified: Secondary | ICD-10-CM | POA: Diagnosis not present

## 2014-04-26 DIAGNOSIS — E780 Pure hypercholesterolemia: Secondary | ICD-10-CM | POA: Insufficient documentation

## 2014-04-26 DIAGNOSIS — Z823 Family history of stroke: Secondary | ICD-10-CM | POA: Insufficient documentation

## 2014-04-26 DIAGNOSIS — D122 Benign neoplasm of ascending colon: Secondary | ICD-10-CM | POA: Insufficient documentation

## 2014-04-26 DIAGNOSIS — Z91018 Allergy to other foods: Secondary | ICD-10-CM | POA: Insufficient documentation

## 2014-04-26 DIAGNOSIS — Z955 Presence of coronary angioplasty implant and graft: Secondary | ICD-10-CM | POA: Insufficient documentation

## 2014-04-26 DIAGNOSIS — Z8249 Family history of ischemic heart disease and other diseases of the circulatory system: Secondary | ICD-10-CM | POA: Diagnosis not present

## 2014-04-26 DIAGNOSIS — D123 Benign neoplasm of transverse colon: Secondary | ICD-10-CM | POA: Insufficient documentation

## 2014-04-26 DIAGNOSIS — Z1211 Encounter for screening for malignant neoplasm of colon: Secondary | ICD-10-CM | POA: Diagnosis present

## 2014-04-26 HISTORY — PX: COLONOSCOPY WITH PROPOFOL: SHX5780

## 2014-04-26 HISTORY — DX: Pneumonia, unspecified organism: J18.9

## 2014-04-26 SURGERY — COLONOSCOPY WITH PROPOFOL
Anesthesia: Monitor Anesthesia Care

## 2014-04-26 MED ORDER — LACTATED RINGERS IV SOLN
INTRAVENOUS | Status: DC
  Administered 2014-04-26: 1000 mL via INTRAVENOUS

## 2014-04-26 MED ORDER — PROPOFOL INFUSION 10 MG/ML OPTIME
INTRAVENOUS | Status: DC | PRN
  Administered 2014-04-26: 100 ug/kg/min via INTRAVENOUS

## 2014-04-26 MED ORDER — PROPOFOL 10 MG/ML IV BOLUS
INTRAVENOUS | Status: AC
  Filled 2014-04-26: qty 40

## 2014-04-26 MED ORDER — SODIUM CHLORIDE 0.9 % IV SOLN: INTRAVENOUS | Status: DC

## 2014-04-26 SURGICAL SUPPLY — 21 items

## 2014-04-26 NOTE — Transfer of Care (Signed)
Immediate Anesthesia Transfer of Care Note  Patient: Cory Conley  Procedure(s) Performed: Procedure(s) (LRB): COLONOSCOPY WITH PROPOFOL (N/A)  Patient Location: PACU  Anesthesia Type: MAC  Level of Consciousness: sedated, patient cooperative and responds to stimulation  Airway & Oxygen Therapy: Patient Spontanous Breathing and Patient connected to face mask oxgen  Post-op Assessment: Report given to PACU RN and Post -op Vital signs reviewed and stable  Post vital signs: Reviewed and stable  Complications: No apparent anesthesia complications

## 2014-04-26 NOTE — Anesthesia Preprocedure Evaluation (Addendum)
Anesthesia Evaluation  Patient identified by MRN, date of birth, ID band Patient awake    Reviewed: Allergy & Precautions, NPO status , Patient's Chart, lab work & pertinent test results  Airway Mallampati: II  TM Distance: >3 FB Neck ROM: Full    Dental no notable dental hx.    Pulmonary pneumonia -, resolved, former smoker,  breath sounds clear to auscultation  Pulmonary exam normal       Cardiovascular hypertension, Pt. on medications + CAD Rhythm:Regular Rate:Normal     Neuro/Psych negative neurological ROS  negative psych ROS   GI/Hepatic Neg liver ROS, PUD, GERD-  ,  Endo/Other  negative endocrine ROS  Renal/GU Renal disease  negative genitourinary   Musculoskeletal negative musculoskeletal ROS (+)   Abdominal   Peds negative pediatric ROS (+)  Hematology negative hematology ROS (+)   Anesthesia Other Findings   Reproductive/Obstetrics negative OB ROS                            Anesthesia Physical Anesthesia Plan  ASA: III  Anesthesia Plan: MAC   Post-op Pain Management:    Induction: Intravenous  Airway Management Planned:   Additional Equipment:   Intra-op Plan:   Post-operative Plan:   Informed Consent: I have reviewed the patients History and Physical, chart, labs and discussed the procedure including the risks, benefits and alternatives for the proposed anesthesia with the patient or authorized representative who has indicated his/her understanding and acceptance.   Dental advisory given  Plan Discussed with: CRNA  Anesthesia Plan Comments:         Anesthesia Quick Evaluation

## 2014-04-26 NOTE — Op Note (Signed)
Procedure: Surveillance colonoscopy. 11/08/2008 colonoscopy with removal of a 4 mm rectal adenomatous polyp.  Endoscopist: Earle Gell  Premedication: Propofol administered by anesthesia  Procedure: The patient was placed in the left lateral decubitus position. Anal inspection and digital rectal exam were normal. The patient has benign prostatic hypertrophy by history. The Pentax pediatric colonoscope was introduced into the rectum and advanced to the cecum. A normal-appearing appendiceal orifice was identified. A normal-appearing ileocecal valve was intubated and the terminal ileum inspected. Colonic preparation for the exam today was good. Withdrawal time was 14 minutes  Rectum. Normal. Retroflexed view of the distal rectum normal  Sigmoid colon and descending colon. Left colonic diverticulosis  Splenic flexure. Normal  Transverse colon. From the mid transverse colon a 3 mm sessile polyp was removed with the cold biopsy forceps  Hepatic flexure. Normal  Ascending colon. A 4 mm sessile polyp was removed with the cold snare from the proximal ascending colon  Cecum and ileocecal valve. Normal  Terminal ileum. Normal  Assessment: A 4 mm sessile polyp was removed from the proximal ascending colon and a 3 mm sessile polyp was removed from the mid transverse colon.  Recommendation: Repeat surveillance colonoscopy is not recommended

## 2014-04-26 NOTE — Anesthesia Postprocedure Evaluation (Signed)
  Anesthesia Post-op Note  Patient: Cory Conley  Procedure(s) Performed: Procedure(s) (LRB): COLONOSCOPY WITH PROPOFOL (N/A)  Patient Location: PACU  Anesthesia Type: MAC  Level of Consciousness: awake and alert   Airway and Oxygen Therapy: Patient Spontanous Breathing  Post-op Pain: mild  Post-op Assessment: Post-op Vital signs reviewed, Patient's Cardiovascular Status Stable, Respiratory Function Stable, Patent Airway and No signs of Nausea or vomiting  Last Vitals:  Filed Vitals:   04/26/14 1030  BP: 135/56  Pulse: 44  Temp:   Resp: 13    Post-op Vital Signs: stable   Complications: No apparent anesthesia complications

## 2014-04-26 NOTE — H&P (Signed)
  Procedure: Surveillance colonoscopy. Colonoscopy with removal of a 4 mm rectal adenomatous polyp performed on 11/08/2008  History: The patient is a 81 year old male born 12/19/1933. He is scheduled to undergo a surveillance colonoscopy today.  Past medical history: Coronary artery disease. Coronary artery stent placement in 2003. Hypercholesterolemia. Benign prostatic hypertrophy. Gastroesophageal reflux. Stable right upper lobe pulmonary nodule. Stage III chronic kidney disease. Tonsillectomy.  Medication allergies: Statin drugs cause muscle aches  Exam: The patient is alert and lying comfortably on the endoscopy stretcher. Abdomen is soft and nontender to palpation. Lungs are clear to auscultation. Cardiac exam reveals a regular rhythm with extrasystoles.  Plan: Proceed with surveillance colonoscopy

## 2014-04-27 ENCOUNTER — Encounter (HOSPITAL_COMMUNITY): Payer: Self-pay | Admitting: Gastroenterology

## 2014-05-20 ENCOUNTER — Other Ambulatory Visit: Payer: Self-pay | Admitting: Family Medicine

## 2014-05-20 DIAGNOSIS — R911 Solitary pulmonary nodule: Secondary | ICD-10-CM

## 2014-05-24 ENCOUNTER — Ambulatory Visit
Admission: RE | Admit: 2014-05-24 | Discharge: 2014-05-24 | Disposition: A | Discharge status: Home / Self Care | Payer: Medicare Other | Source: Ambulatory Visit | Attending: Family Medicine | Admitting: Family Medicine

## 2014-05-24 DIAGNOSIS — R911 Solitary pulmonary nodule: Secondary | ICD-10-CM

## 2014-06-08 ENCOUNTER — Encounter: Payer: Self-pay | Admitting: Cardiology

## 2014-06-27 ENCOUNTER — Other Ambulatory Visit: Payer: Self-pay | Admitting: Family Medicine

## 2014-06-27 ENCOUNTER — Ambulatory Visit
Admission: RE | Admit: 2014-06-27 | Discharge: 2014-06-27 | Disposition: A | Discharge status: Home / Self Care | Payer: Medicare Other | Source: Ambulatory Visit | Attending: Family Medicine | Admitting: Family Medicine

## 2014-06-27 DIAGNOSIS — R911 Solitary pulmonary nodule: Secondary | ICD-10-CM

## 2014-08-25 ENCOUNTER — Other Ambulatory Visit: Payer: Self-pay | Admitting: Family Medicine

## 2014-08-25 DIAGNOSIS — R918 Other nonspecific abnormal finding of lung field: Secondary | ICD-10-CM

## 2014-08-25 DIAGNOSIS — R911 Solitary pulmonary nodule: Secondary | ICD-10-CM

## 2014-08-31 ENCOUNTER — Ambulatory Visit
Admission: RE | Admit: 2014-08-31 | Discharge: 2014-08-31 | Disposition: A | Discharge status: Home / Self Care | Payer: Medicare Other | Source: Ambulatory Visit | Attending: Family Medicine | Admitting: Family Medicine

## 2014-08-31 ENCOUNTER — Other Ambulatory Visit: Payer: Self-pay | Admitting: Family Medicine

## 2014-08-31 DIAGNOSIS — R918 Other nonspecific abnormal finding of lung field: Secondary | ICD-10-CM

## 2014-08-31 DIAGNOSIS — R911 Solitary pulmonary nodule: Secondary | ICD-10-CM

## 2014-09-02 ENCOUNTER — Other Ambulatory Visit: Payer: Medicare Other

## 2014-09-03 NOTE — Progress Notes (Signed)
Cardiology Office Note   Date:  09/05/2014   ID:  Cory Conley, DOB [birth year], MRN [id number]  PCP:  Mayra Neer, MD    Chief Complaint  Patient presents with  . Coronary Artery Disease  . Follow-up    hypertension  . Follow-up    hyperlipidemia      History of Present Illness: Cory Conley is a 81 y.o. male with a history of ASCAD s/p PCI x 2 in 2003, dyslipdemia and HTN who presents today for followup. He is doing well. He denies any chest pain, SOB, DOE, dizziness, palpitations or syncope.  He occasionally has some LE edema at the end of the day.        Past Medical History  Diagnosis Date  . Dyslipidemia   . History of BPH   . GERD (gastroesophageal reflux disease)   . Peptic ulcer disease   . ED (erectile dysfunction)   . Pulmonary nodule, right     RUL 5 mm-repeat CT 03/2013  . Chronic kidney disease     stage 3  . Hypertension   . Coronary artery disease     post stent X2 in 2003  . Pneumonia     1 year ago    Past Surgical History  Procedure Laterality Date  . Hip surgery    . Tonsillectomy    . Coronary angioplasty with stent placement      of LAD 05/2001, RCA 06/2001  . Colonoscopy      49yrs 7/10  . Colonoscopy with propofol N/A 04/26/2014    Procedure: COLONOSCOPY WITH PROPOFOL;  Surgeon: Garlan Fair, MD;  Location: [facility];  Service: Endoscopy;  Laterality: N/A;     Current Outpatient Prescriptions  Medication Sig Dispense Refill  . amLODipine (NORVASC) 10 MG tablet Take 10 mg by mouth every morning.     Marland Kitchen aspirin 81 MG tablet Take 1 tablet (81 mg total) by mouth daily.    Marland Kitchen atorvastatin (LIPITOR) 10 MG tablet Take 10 mg by mouth daily.    . Coenzyme Q10 (COQ10) 200 MG CAPS Take 200 mg by mouth daily.    Marland Kitchen ibuprofen (ADVIL,MOTRIN) 200 MG tablet Take 400 mg by mouth every 6 (six) hours as needed for pain or fever.     . Multiple Vitamin (MULTIVITAMIN WITH MINERALS) TABS Take 1 tablet by mouth daily.    Marland Kitchen omeprazole  (PRILOSEC) 20 MG capsule Take 20 mg by mouth daily.    . ramipril (ALTACE) 5 MG tablet Take 5 mg by mouth every morning.     . silodosin (RAPAFLO) 8 MG CAPS capsule Take 8 mg by mouth daily with breakfast.     No current facility-administered medications for this visit.    Allergies:   Crestor; Lipitor; Niacin and related; Simvastatin; and Vytorin    Social History:  The patient  reports that he has quit smoking. His smoking use included Cigarettes. He does not have any smokeless tobacco history on file. He reports that he does not drink alcohol or use illicit drugs.   Family History:  The patient's family history includes CVA in his mother; Peripheral vascular disease in his father.    ROS:  Please see the history of present illness.   Otherwise, review of systems are positive for none.   All other systems are reviewed and negative.    PHYSICAL EXAM: VS:  BP 120/76 mmHg  Pulse 55  Ht 5\' 8"  (1.727 m)  Wt 188 lb 12.8  oz (85.639 kg)  BMI 28.71 kg/m2 , BMI Body mass index is 28.71 kg/(m^2). GEN: Well nourished, well developed, in no acute distress HEENT: normal Neck: no JVD, carotid bruits, or masses Cardiac: RRR; no murmurs, rubs, or gallops,no edema  Respiratory:  clear to auscultation bilaterally, normal work of breathing GI: soft, nontender, nondistended, + BS MS: no deformity or atrophy Skin: warm and dry, no rash Neuro:  Strength and sensation are intact Psych: euthymic mood, full affect   EKG:  EKG was ordered today and shows sinus bradycardia at 55bpm with nonspecific T wave abnormality    Recent Labs: No results found for requested labs within last 365 days.    Lipid Panel    Component Value Date/Time   CHOL 118 08/31/2013 0922   TRIG 77.0 08/31/2013 0922   HDL 31.60* 08/31/2013 0922   CHOLHDL 4 08/31/2013 0922   VLDL 15.4 08/31/2013 0922   LDLCALC 71 08/31/2013 0922      Wt Readings from Last 3 Encounters:  09/05/14 188 lb 12.8 oz (85.639 kg)  08/31/13  189 lb (85.73 kg)    ASSESSMENT AND PLAN:  1. ASCAD s/p remote PCI with no angina - continue ASA 2. Dyslipidemia - check fasting lipids and ALT - continue atorvastatin 3. HTN well controlled - continue amlodipine/ramipril 4. Asymptomatic bradycardia    Current medicines are reviewed at length with the patient today.  The patient does not have concerns regarding medicines.  The following changes have been made:  no change  Labs/ tests ordered today include: see above assessment and plan No orders of the defined types were placed in this encounter.     Disposition:   FU with me in 1 year   Signed, Sueanne Margarita, MD  09/05/2014 9:15 AM    [facility] [place], [place], [place]  [address] Phone: [phone]; Fax: [phone]

## 2014-09-05 ENCOUNTER — Ambulatory Visit (INDEPENDENT_AMBULATORY_CARE_PROVIDER_SITE_OTHER): Payer: Medicare Other | Admitting: Cardiology

## 2014-09-05 ENCOUNTER — Other Ambulatory Visit (INDEPENDENT_AMBULATORY_CARE_PROVIDER_SITE_OTHER): Payer: Medicare Other | Admitting: *Deleted

## 2014-09-05 ENCOUNTER — Encounter: Payer: Self-pay | Admitting: Cardiology

## 2014-09-05 VITALS — BP 120/76 | HR 55 | Ht 68.0 in | Wt 188.8 lb

## 2014-09-05 DIAGNOSIS — E785 Hyperlipidemia, unspecified: Secondary | ICD-10-CM | POA: Diagnosis not present

## 2014-09-05 DIAGNOSIS — I10 Essential (primary) hypertension: Secondary | ICD-10-CM

## 2014-09-05 DIAGNOSIS — I2510 Atherosclerotic heart disease of native coronary artery without angina pectoris: Secondary | ICD-10-CM | POA: Diagnosis not present

## 2014-09-05 LAB — BASIC METABOLIC PANEL
BUN: 16 mg/dL (ref 6–23)
CO2: 29 meq/L (ref 19–32)
Calcium: 9.8 mg/dL (ref 8.4–10.5)
Chloride: 103 mEq/L (ref 96–112)
Creatinine, Ser: 1.44 mg/dL (ref 0.40–1.50)
GFR: 50.26 mL/min — ABNORMAL LOW (ref >60.00)
Glucose, Bld: 108 mg/dL — ABNORMAL HIGH (ref 70–99)
POTASSIUM: 4.5 meq/L (ref 3.5–5.1)
SODIUM: 137 meq/L (ref 135–145)

## 2014-09-05 LAB — HEPATIC FUNCTION PANEL
ALT: 24 U/L (ref 0–53)
AST: 24 U/L (ref 0–37)
Albumin: 4.2 g/dL (ref 3.5–5.2)
Alkaline Phosphatase: 82 U/L (ref 39–117)
BILIRUBIN TOTAL: 0.5 mg/dL (ref 0.2–1.2)
Bilirubin, Direct: 0.1 mg/dL (ref 0.0–0.3)
Total Protein: 7 g/dL (ref 6.0–8.3)

## 2014-09-05 LAB — LIPID PANEL
CHOL/HDL RATIO: 3
Cholesterol: 140 mg/dL (ref 0–200)
HDL: 40.5 mg/dL (ref >39.00)
LDL CALC: 78 mg/dL (ref 0–99)
NONHDL: 99.5
TRIGLYCERIDES: 110 mg/dL (ref 0.0–149.0)
VLDL: 22 mg/dL (ref 0.0–40.0)

## 2014-09-05 NOTE — Patient Instructions (Signed)
Medication Instructions:  Your physician recommends that you continue on your current medications as directed. Please refer to the Current Medication list given to you today.   Labwork: TODAY: BMET, LFTs, Lipids  Testing/Procedures: None  Follow-Up: Your physician wants you to follow-up in: 1 year with Dr. Radford Pax. You will receive a reminder letter in the mail two months in advance. If you don't receive a letter, please call our office to schedule the follow-up appointment.   Any Other Special Instructions Will Be Listed Below (If Applicable).

## 2014-09-06 ENCOUNTER — Telehealth: Payer: Self-pay

## 2014-09-06 DIAGNOSIS — E785 Hyperlipidemia, unspecified: Secondary | ICD-10-CM

## 2014-09-06 MED ORDER — ATORVASTATIN CALCIUM 20 MG PO TABS: 20.0000 mg | ORAL_TABLET | Freq: Every day | ORAL | Status: DC

## 2014-09-06 NOTE — Telephone Encounter (Signed)
Informed patient of results and verbal understanding expressed.  Instructed patient to INCREASE LIPITOR to 20 mg daily. Repeat FLP and ALT scheduled June 29. Patient agrees with treatment plan.

## 2014-09-06 NOTE — Telephone Encounter (Signed)
-----   Message from Sueanne Margarita, MD sent at 09/05/2014  2:51 PM EDT ----- LDL not at goal - increase Lipitor to 20mg  daily and recheck FLP and ALT in 6 weeks

## 2014-09-10 IMAGING — CR DG CHEST 2V
2 series · 2 of 2 positions shown · non-contrast
Comparison: September 29, 2011.

CLINICAL DATA: Pneumonia

CHEST - 2 VIEW

[w chest pa]
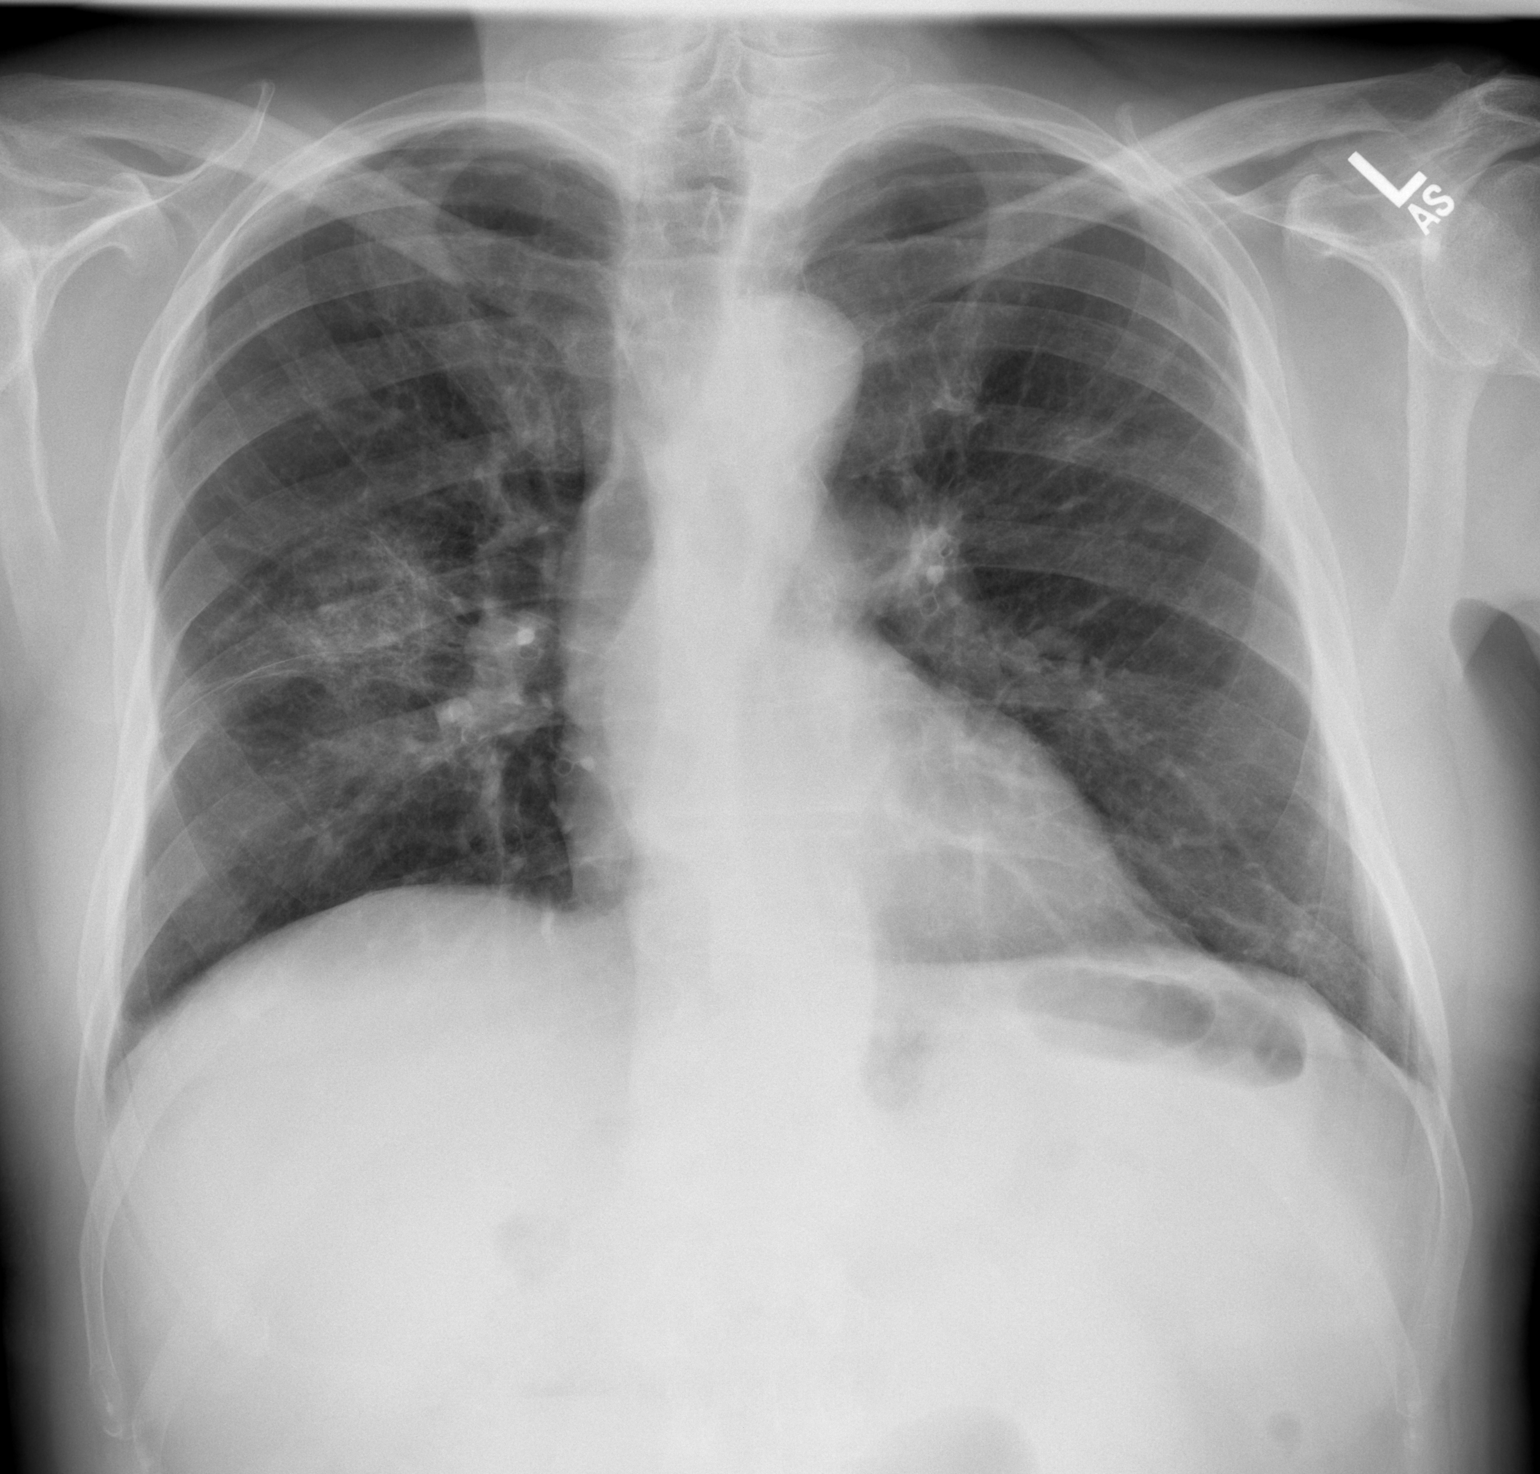

[w chest lat]
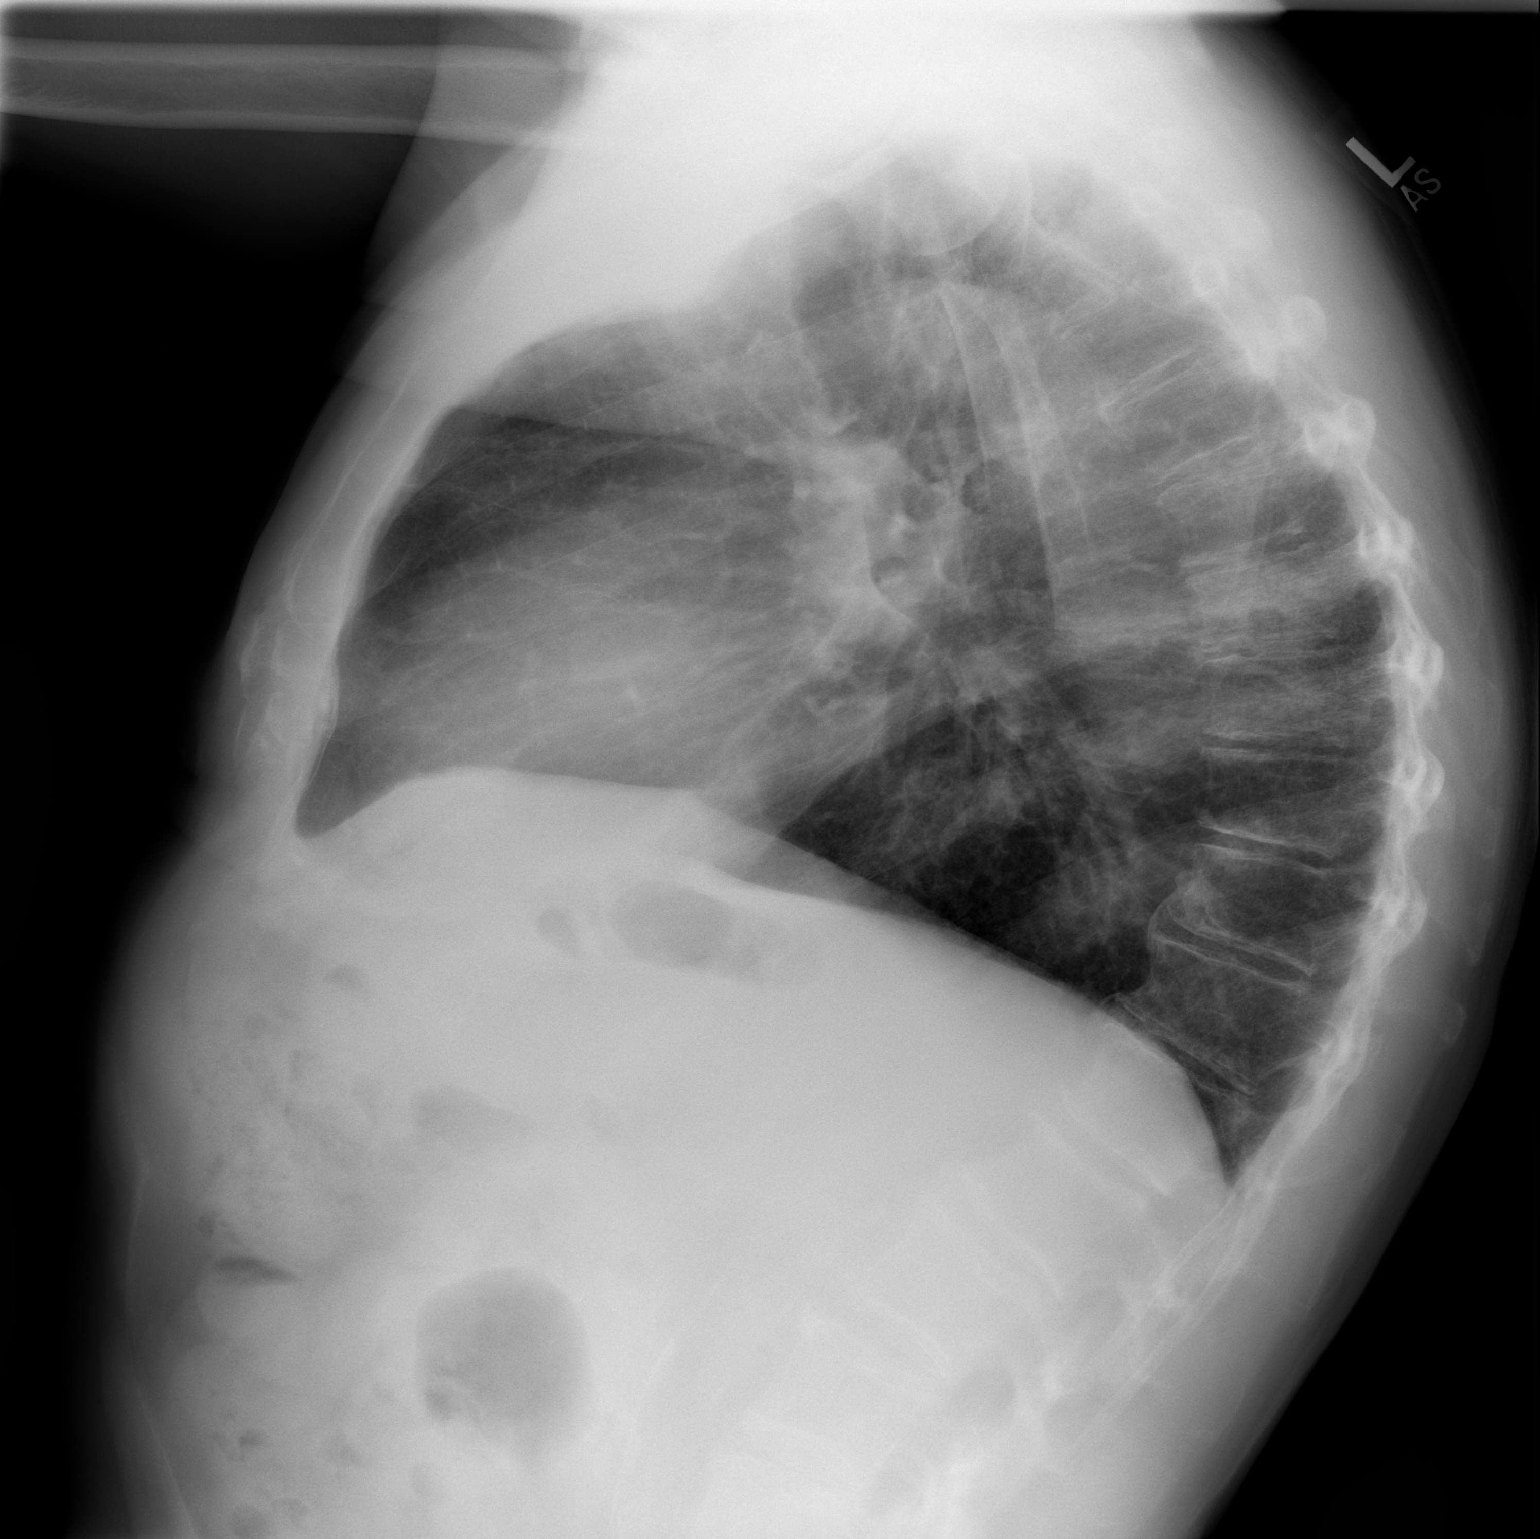

[2 of 2 positions shown; findings below may reference images not displayed]

FINDINGS: Opacity is again noted and superior segment of right
lower lobe which is significantly improved compared to prior exam
consistent with improving pneumonia.  Left lung is clear.
Cardiomediastinal silhouette appears normal.  No pleural effusion
or pneumothorax is noted.  Bony thorax is intact.
IMPRESSION: Significantly improved right lower lobe pneumonia, although
residual density remains.

## 2014-10-17 ENCOUNTER — Other Ambulatory Visit (INDEPENDENT_AMBULATORY_CARE_PROVIDER_SITE_OTHER): Payer: Medicare Other | Admitting: *Deleted

## 2014-10-17 DIAGNOSIS — E785 Hyperlipidemia, unspecified: Secondary | ICD-10-CM

## 2014-10-17 LAB — LIPID PANEL
CHOLESTEROL: 113 mg/dL (ref 0–200)
HDL: 33 mg/dL — ABNORMAL LOW (ref >39.00)
LDL Cholesterol: 58 mg/dL (ref 0–99)
NonHDL: 80
Total CHOL/HDL Ratio: 3
Triglycerides: 108 mg/dL (ref 0.0–149.0)
VLDL: 21.6 mg/dL (ref 0.0–40.0)

## 2014-10-17 LAB — ALT: ALT: 22 U/L (ref 0–53)

## 2014-10-24 ENCOUNTER — Emergency Department (HOSPITAL_COMMUNITY): Payer: Medicare Other

## 2014-10-24 ENCOUNTER — Encounter (HOSPITAL_COMMUNITY): Payer: Self-pay | Admitting: Emergency Medicine

## 2014-10-24 ENCOUNTER — Ambulatory Visit (HOSPITAL_COMMUNITY)
Admission: EM | Admit: 2014-10-24 | Discharge: 2014-10-25 | Disposition: A | Discharge status: Home / Self Care | Payer: Medicare Other | Attending: Orthopedic Surgery | Admitting: Orthopedic Surgery

## 2014-10-24 DIAGNOSIS — I2510 Atherosclerotic heart disease of native coronary artery without angina pectoris: Secondary | ICD-10-CM | POA: Insufficient documentation

## 2014-10-24 DIAGNOSIS — Z87891 Personal history of nicotine dependence: Secondary | ICD-10-CM | POA: Insufficient documentation

## 2014-10-24 DIAGNOSIS — E785 Hyperlipidemia, unspecified: Secondary | ICD-10-CM | POA: Insufficient documentation

## 2014-10-24 DIAGNOSIS — W298XXA Contact with other powered powered hand tools and household machinery, initial encounter: Secondary | ICD-10-CM | POA: Diagnosis not present

## 2014-10-24 DIAGNOSIS — N529 Male erectile dysfunction, unspecified: Secondary | ICD-10-CM | POA: Insufficient documentation

## 2014-10-24 DIAGNOSIS — S61319A Laceration without foreign body of unspecified finger with damage to nail, initial encounter: Secondary | ICD-10-CM

## 2014-10-24 DIAGNOSIS — W458XXA Other foreign body or object entering through skin, initial encounter: Secondary | ICD-10-CM | POA: Diagnosis not present

## 2014-10-24 DIAGNOSIS — N400 Enlarged prostate without lower urinary tract symptoms: Secondary | ICD-10-CM | POA: Insufficient documentation

## 2014-10-24 DIAGNOSIS — Z955 Presence of coronary angioplasty implant and graft: Secondary | ICD-10-CM | POA: Diagnosis not present

## 2014-10-24 DIAGNOSIS — S61111A Laceration without foreign body of right thumb with damage to nail, initial encounter: Secondary | ICD-10-CM | POA: Diagnosis not present

## 2014-10-24 DIAGNOSIS — Z791 Long term (current) use of non-steroidal anti-inflammatories (NSAID): Secondary | ICD-10-CM | POA: Diagnosis not present

## 2014-10-24 DIAGNOSIS — S62521B Displaced fracture of distal phalanx of right thumb, initial encounter for open fracture: Secondary | ICD-10-CM | POA: Diagnosis present

## 2014-10-24 DIAGNOSIS — Z7982 Long term (current) use of aspirin: Secondary | ICD-10-CM | POA: Insufficient documentation

## 2014-10-24 DIAGNOSIS — I129 Hypertensive chronic kidney disease with stage 1 through stage 4 chronic kidney disease, or unspecified chronic kidney disease: Secondary | ICD-10-CM | POA: Insufficient documentation

## 2014-10-24 DIAGNOSIS — N189 Chronic kidney disease, unspecified: Secondary | ICD-10-CM | POA: Insufficient documentation

## 2014-10-24 DIAGNOSIS — K219 Gastro-esophageal reflux disease without esophagitis: Secondary | ICD-10-CM | POA: Insufficient documentation

## 2014-10-24 DIAGNOSIS — M1990 Unspecified osteoarthritis, unspecified site: Secondary | ICD-10-CM | POA: Diagnosis not present

## 2014-10-24 LAB — CBC WITH DIFFERENTIAL/PLATELET
Basophils Absolute: 0 10*3/uL (ref 0.0–0.1)
Basophils Relative: 0 % (ref 0–1)
Eosinophils Absolute: 0.4 10*3/uL (ref 0.0–0.7)
Eosinophils Relative: 4 % (ref 0–5)
HCT: 42 % (ref 39.0–52.0)
HEMOGLOBIN: 13.7 g/dL (ref 13.0–17.0)
LYMPHS PCT: 12 % (ref 12–46)
Lymphs Abs: 1.1 10*3/uL (ref 0.7–4.0)
MCH: 29.5 pg (ref 26.0–34.0)
MCHC: 32.6 g/dL (ref 30.0–36.0)
MCV: 90.5 fL (ref 78.0–100.0)
MONO ABS: 0.7 10*3/uL (ref 0.1–1.0)
MONOS PCT: 8 % (ref 3–12)
Neutro Abs: 6.6 10*3/uL (ref 1.7–7.7)
Neutrophils Relative %: 76 % (ref 43–77)
Platelets: 186 10*3/uL (ref 150–400)
RBC: 4.64 MIL/uL (ref 4.22–5.81)
RDW: 14.3 % (ref 11.5–15.5)
WBC: 8.7 10*3/uL (ref 4.0–10.5)

## 2014-10-24 LAB — BASIC METABOLIC PANEL
Anion gap: 7 (ref 5–15)
BUN: 25 mg/dL — AB (ref 6–20)
CHLORIDE: 106 mmol/L (ref 101–111)
CO2: 25 mmol/L (ref 22–32)
Calcium: 9.4 mg/dL (ref 8.9–10.3)
Creatinine, Ser: 1.49 mg/dL — ABNORMAL HIGH (ref 0.61–1.24)
GFR calc Af Amer: 50 mL/min — ABNORMAL LOW (ref >60)
GFR, EST NON AFRICAN AMERICAN: 43 mL/min — AB (ref >60)
Glucose, Bld: 108 mg/dL — ABNORMAL HIGH (ref 65–99)
Potassium: 4.5 mmol/L (ref 3.5–5.1)
Sodium: 138 mmol/L (ref 135–145)

## 2014-10-24 MED ORDER — BUPIVACAINE HCL 0.5 % IJ SOLN
50.0000 mL | Freq: Once | INTRAMUSCULAR | Status: DC
  Filled 2014-10-24: qty 50

## 2014-10-24 NOTE — ED Provider Notes (Signed)
CSN: [id number]     Arrival date & time 10/24/14  1901 History  This chart was scribed for non-physician practitioner, Delsa Grana PA-C, working with Pattricia Boss, MD by Altamease Oiler, ED Scribe. This patient was seen in room TR05C/TR05C and the patient's care was started at 9:02 PM.    Chief Complaint  Patient presents with  . Finger Injury    The history is provided by the patient and the spouse. No language interpreter was used.   Cory Conley is a 81 y.o. male who presents to the [facility] ED, as a transfer from [facility] ED for Dr. Fredna Dow to take to the OR, presents with right thumb laceration with onset around 6 PM. He states that he cut it on a table saw. He does not recall his last tetanus, will f/u on it tomorrow after OR. He takes 81 mg of aspirin daily but denies other anticoagulation. No other injuries. No numbness or weakness to finger. Pressure applied at home. Pt denies CP, SOB, abdominal pain. His pain is currently tolerable as the digit was numbed at the previous facility.  He last ate around 12:30 PM. He drank a sip of water PTA.  Past Medical History  Diagnosis Date  . Dyslipidemia   . History of BPH   . GERD (gastroesophageal reflux disease)   . Peptic ulcer disease   . ED (erectile dysfunction)   . Pulmonary nodule, right     RUL 5 mm-repeat CT 03/2013  . Chronic kidney disease     stage 3  . Hypertension   . Coronary artery disease     post stent X2 in 2003  . Pneumonia     1 year ago   Past Surgical History  Procedure Laterality Date  . Hip surgery    . Tonsillectomy    . Coronary angioplasty with stent placement      of LAD 05/2001, RCA 06/2001  . Colonoscopy      43yrs 7/10  . Colonoscopy with propofol N/A 04/26/2014    Procedure: COLONOSCOPY WITH PROPOFOL;  Surgeon: Garlan Fair, MD;  Location: [facility];  Service: Endoscopy;  Laterality: N/A;   Family History  Problem Relation Age of Onset  . CVA Mother   . Peripheral vascular disease Father     History  Substance Use Topics  . Smoking status: Former Smoker    Types: Cigarettes  . Smokeless tobacco: Not on file     Comment: quit in 1984  . Alcohol Use: No    Review of Systems 10 Systems reviewed and are negative for acute change except as noted in the HPI.       Allergies  Crestor; Lipitor; Niacin and related; Simvastatin; and Vytorin  Home Medications   Prior to Admission medications   Medication Sig Start Date End Date Taking? Authorizing Provider  amLODipine (NORVASC) 10 MG tablet Take 10 mg by mouth every morning.     Historical Provider, MD  aspirin 81 MG tablet Take 1 tablet (81 mg total) by mouth daily. 08/31/13   Sueanne Margarita, MD  atorvastatin (LIPITOR) 20 MG tablet Take 1 tablet (20 mg total) by mouth daily. 09/06/14   Sueanne Margarita, MD  Coenzyme Q10 (COQ10) 200 MG CAPS Take 200 mg by mouth daily.    Historical Provider, MD  ibuprofen (ADVIL,MOTRIN) 200 MG tablet Take 400 mg by mouth every 6 (six) hours as needed for pain or fever.     Historical Provider, MD  Multiple Vitamin (MULTIVITAMIN WITH MINERALS) TABS Take 1 tablet by mouth daily.    Historical Provider, MD  omeprazole (PRILOSEC) 20 MG capsule Take 20 mg by mouth daily.    Historical Provider, MD  ramipril (ALTACE) 5 MG tablet Take 5 mg by mouth every morning.     Historical Provider, MD  silodosin (RAPAFLO) 8 MG CAPS capsule Take 8 mg by mouth daily with breakfast.    Historical Provider, MD   Triage Vitals: BP 141/78 mmHg  Pulse 47  Temp(Src) 98.5 F (36.9 C) (Oral)  Resp 20  SpO2 99% Physical Exam  Constitutional: He is oriented to person, place, and time. He appears well-developed and well-nourished. No distress.  HENT:  Head: Normocephalic and atraumatic.  Right Ear: External ear normal.  Left Ear: External ear normal.  Nose: Nose normal.  Mouth/Throat: Oropharynx is clear and moist. No oropharyngeal exudate.  Eyes: Conjunctivae and EOM are normal. Pupils are equal, round, and  reactive to light. Right eye exhibits no discharge. Left eye exhibits no discharge. No scleral icterus.  Neck: Normal range of motion. Neck supple. No JVD present. No tracheal deviation present. No thyromegaly present.  Cardiovascular: Normal rate, regular rhythm, normal heart sounds and intact distal pulses.  Exam reveals no gallop and no friction rub.   No murmur heard. Pulmonary/Chest: Effort normal and breath sounds normal. No accessory muscle usage. No respiratory distress. He has no decreased breath sounds. He has no wheezes. He has no rhonchi. He has no rales. He exhibits no tenderness.  Abdominal: Soft. Bowel sounds are normal. He exhibits no distension and no mass. There is no tenderness. There is no rebound and no guarding.  Musculoskeletal: Normal range of motion. He exhibits no edema or tenderness.  Lymphadenopathy:    He has no cervical adenopathy.  Neurological: He is alert and oriented to person, place, and time. He has normal reflexes. No cranial nerve deficit. He exhibits normal muscle tone. Coordination normal.  Skin: Skin is warm and dry. No rash noted. He is not diaphoretic. No cyanosis or erythema. No pallor. Nails show no clubbing.  Bandaged laceration of right thumb, no visible bleeding  Psychiatric: He has a normal mood and affect. His behavior is normal. Judgment and thought content normal.  Nursing note and vitals reviewed.   ED Course  Procedures   DIAGNOSTIC STUDIES: Oxygen Saturation is 99% on RA, normal by my interpretation.    COORDINATION OF CARE: 9:07 PM Discussed treatment plan which includes EKG and calling Dr. Fredna Dow with pt at bedside and pt agreed to plan.  Labs Review Labs Reviewed  BASIC METABOLIC PANEL - Abnormal; Notable for the following:    Glucose, Bld 108 (*)    BUN 25 (*)    Creatinine, Ser 1.49 (*)    GFR calc non Af Amer 43 (*)    GFR calc Af Amer 50 (*)    All other components within normal limits  CBC WITH DIFFERENTIAL/PLATELET     Imaging Review Dg Finger Thumb Right  10/24/2014   CLINICAL DATA:  Saw injury with laceration of the thumb.  EXAM: RIGHT THUMB 2+V  COMPARISON:  None.  FINDINGS: There is soft tissue deformity of the distal aspect of the thumb. There is an angular defect of the distal tuft consistent with mechanical saw injury. Some small bone fragments are seen in the soft tissues. No fracture extending to the joint. Ordinary mild osteoarthritis noted at the inner phalangeal joint.  IMPRESSION: Saw injury of the distal  soft tissues and distal tuft.   Electronically Signed   By: Nelson Chimes M.D.   On: 10/24/2014 19:53    EKG Interpretation None      MDM   Final diagnoses:  Laceration of finger nail bed, initial encounter  Open fracture of tuft of distal phalanx of thumb, right, initial encounter   Patient transferred here from [facility], to be taken to the [facility] by Dr. Fredna Dow. Please see Cardell Peach documentation for initial presentation. Screening exam done here and [place] fast track, EKG, basic labs, and IV placement Dr. Fredna Dow has been notified of this patient is here and ready for the OR - per the RN Pt refusing tetanus shot, preferring to call his PCP in the am, he believes it may be current enough.  I personally performed the services described in this documentation, which was scribed in my presence. The recorded information has been reviewed and is accurate.      Delsa Grana, PA-C 11/03/14 [place], MD 11/08/14 [phone]

## 2014-10-24 NOTE — ED Notes (Signed)
Dr. Fredna Dow en route

## 2014-10-24 NOTE — ED Notes (Signed)
Patient here with complaints of right thumb injury. States that he cut it with the saw. Bleeding controlled. Takes an Asprin a day.

## 2014-10-24 NOTE — ED Provider Notes (Signed)
CSN: [id number]     Arrival date & time 10/24/14  1901 History  This chart was scribed for non-physician practitioner, Jeannett Senior, PA-C, working with Varney Biles, MD, by Helane Gunther ED Scribe. This patient was seen in room WTR6/WTR6 and the patient's care was started at 7:18 PM    Chief Complaint  Patient presents with  . Finger Injury   The history is provided by the patient. No language interpreter was used.   HPI Comments: Cory Conley is a 81 y.o. male who presents to the Emergency Department complaining of a right thumb laceration just  PTA. He states that he cut it on a table saw. He does not recall his last tetanus shot but refused one here, wants to call PCP tomorrow. He states that he is on baby aspirin. No other injuries. No numbness or weakness to finger. Pressure applied at home.   Past Medical History  Diagnosis Date  . Dyslipidemia   . History of BPH   . GERD (gastroesophageal reflux disease)   . Peptic ulcer disease   . ED (erectile dysfunction)   . Pulmonary nodule, right     RUL 5 mm-repeat CT 03/2013  . Chronic kidney disease     stage 3  . Hypertension   . Coronary artery disease     post stent X2 in 2003  . Pneumonia     1 year ago   Past Surgical History  Procedure Laterality Date  . Hip surgery    . Tonsillectomy    . Coronary angioplasty with stent placement      of LAD 05/2001, RCA 06/2001  . Colonoscopy      19yrs 7/10  . Colonoscopy with propofol N/A 04/26/2014    Procedure: COLONOSCOPY WITH PROPOFOL;  Surgeon: Garlan Fair, MD;  Location: [facility];  Service: Endoscopy;  Laterality: N/A;   Family History  Problem Relation Age of Onset  . CVA Mother   . Peripheral vascular disease Father    History  Substance Use Topics  . Smoking status: Former Smoker    Types: Cigarettes  . Smokeless tobacco: Not on file     Comment: quit in 1984  . Alcohol Use: No    Review of Systems  Constitutional: Negative for fever, chills and  fatigue.  HENT: Negative for sore throat and trouble swallowing.   Respiratory: Negative for cough, shortness of breath and wheezing.   Cardiovascular: Negative for chest pain and palpitations.  Gastrointestinal: Negative for nausea, vomiting, abdominal pain and blood in stool.  Genitourinary: Negative for dysuria, hematuria and flank pain.  Musculoskeletal: Negative for back pain.  Skin: Positive for wound (laceration of right thumb). Negative for rash.  Neurological: Negative for dizziness, weakness and numbness.  Hematological: Does not bruise/bleed easily.    Allergies  Crestor; Lipitor; Niacin and related; Simvastatin; and Vytorin  Home Medications   Prior to Admission medications   Medication Sig Start Date End Date Taking? Authorizing Provider  amLODipine (NORVASC) 10 MG tablet Take 10 mg by mouth every morning.     Historical Provider, MD  aspirin 81 MG tablet Take 1 tablet (81 mg total) by mouth daily. 08/31/13   Sueanne Margarita, MD  atorvastatin (LIPITOR) 20 MG tablet Take 1 tablet (20 mg total) by mouth daily. 09/06/14   Sueanne Margarita, MD  Coenzyme Q10 (COQ10) 200 MG CAPS Take 200 mg by mouth daily.    Historical Provider, MD  ibuprofen (ADVIL,MOTRIN) 200 MG tablet Take 400  mg by mouth every 6 (six) hours as needed for pain or fever.     Historical Provider, MD  Multiple Vitamin (MULTIVITAMIN WITH MINERALS) TABS Take 1 tablet by mouth daily.    Historical Provider, MD  omeprazole (PRILOSEC) 20 MG capsule Take 20 mg by mouth daily.    Historical Provider, MD  ramipril (ALTACE) 5 MG tablet Take 5 mg by mouth every morning.     Historical Provider, MD  silodosin (RAPAFLO) 8 MG CAPS capsule Take 8 mg by mouth daily with breakfast.    Historical Provider, MD   BP 136/80 mmHg  Pulse 55  Temp(Src) 98.5 F (36.9 C) (Oral)  Resp 20  SpO2 97% Physical Exam  Constitutional: He is oriented to person, place, and time. He appears well-developed and well-nourished. No distress.  HENT:   Head: Normocephalic and atraumatic.  Mouth/Throat: Oropharynx is clear and moist.  Eyes: Conjunctivae and EOM are normal. Pupils are equal, round, and reactive to light.  Neck: Normal range of motion. Neck supple. No tracheal deviation present.  Cardiovascular: Normal rate.   Pulmonary/Chest: Breath sounds normal. No respiratory distress.  Abdominal: Soft.  Musculoskeletal: Normal range of motion.  Laceration and swelling to the distal right thumb involving distal half of the nail and palmar surface of the distal phalanx. Sensation intact over medial and lateral distal finger. Cap refill <2 sec.  Neurological: He is alert and oriented to person, place, and time.  Skin: Skin is warm and dry.  Psychiatric: He has a normal mood and affect. His behavior is normal.  Nursing note and vitals reviewed.   ED Course  Procedures  DIAGNOSTIC STUDIES: Oxygen Saturation is 97% on RA, adequate by my interpretation.    COORDINATION OF CARE: 7:21 PM - Discussed plans to order diagnostic imaging. Pt advised of plan for treatment and pt agrees.  Labs Review Labs Reviewed - No data to display  Imaging Review Dg Finger Thumb Right  10/24/2014   CLINICAL DATA:  Saw injury with laceration of the thumb.  EXAM: RIGHT THUMB 2+V  COMPARISON:  None.  FINDINGS: There is soft tissue deformity of the distal aspect of the thumb. There is an angular defect of the distal tuft consistent with mechanical saw injury. Some small bone fragments are seen in the soft tissues. No fracture extending to the joint. Ordinary mild osteoarthritis noted at the inner phalangeal joint.  IMPRESSION: Saw injury of the distal soft tissues and distal tuft.   Electronically Signed   By: Nelson Chimes M.D.   On: 10/24/2014 19:53     EKG Interpretation None          NERVE BLOCK Performed by: Jeannett Senior A Consent: Verbal consent obtained. Required items: required blood products, implants, devices, and special equipment  available Time out: Immediately prior to procedure a "time out" was called to verify the correct patient, procedure, equipment, support staff and site/side marked as required.  Indication: right thumb laceration Nerve block body site: right thumb  Preparation: Patient was prepped and draped in the usual sterile fashion. Needle gauge: 24 G Location technique: anatomical landmarks  Local anesthetic: bupivacaine 0.5%wo epi  Anesthetic total: 4 ml  Outcome: pain improved Patient tolerance: Patient tolerated the procedure well with no immediate complications.     MDM   Final diagnoses:  Laceration of finger nail bed, initial encounter  Open fracture of tuft of distal phalanx of thumb, right, initial encounter    Pt here with laceration to the right thumb,  involving the nail. Xray obtained to ro bone involvement.   8:26 PM Spoke with Dr. Fredna Dow. Requests pt transfer to [facility] ED for him to most likely to take to OR. Asked for CBC and BMP, said ECG  Can be done on arrival. Report called to Dr. Jeanell Sparrow, will transfer by private vehicle. Discussed plan with pt, who agrees.   Filed Vitals:   10/24/14 1913  BP: 136/80  Pulse: 55  Temp: 98.5 F (36.9 C)  Resp: 20   I personally performed the services described in this documentation, which was scribed in my presence. The recorded information has been reviewed and is accurate.   Jeannett Senior, PA-C 10/24/14 2032  Jeannett Senior, PA-C 10/24/14 2033  Varney Biles, MD 10/25/14 [phone]

## 2014-10-25 ENCOUNTER — Emergency Department (HOSPITAL_COMMUNITY): Payer: Medicare Other | Admitting: Anesthesiology

## 2014-10-25 ENCOUNTER — Encounter (HOSPITAL_COMMUNITY)
Admission: EM | Disposition: A | Discharge status: Home / Self Care | Payer: Self-pay | Source: Home / Self Care | Attending: Emergency Medicine

## 2014-10-25 DIAGNOSIS — S61111A Laceration without foreign body of right thumb with damage to nail, initial encounter: Secondary | ICD-10-CM | POA: Diagnosis not present

## 2014-10-25 DIAGNOSIS — K219 Gastro-esophageal reflux disease without esophagitis: Secondary | ICD-10-CM | POA: Diagnosis not present

## 2014-10-25 DIAGNOSIS — E785 Hyperlipidemia, unspecified: Secondary | ICD-10-CM | POA: Diagnosis not present

## 2014-10-25 DIAGNOSIS — S62521B Displaced fracture of distal phalanx of right thumb, initial encounter for open fracture: Secondary | ICD-10-CM | POA: Diagnosis not present

## 2014-10-25 HISTORY — PX: I&D EXTREMITY: SHX5045

## 2014-10-25 SURGERY — IRRIGATION AND DEBRIDEMENT EXTREMITY
Anesthesia: General | Site: Thumb | Laterality: Right

## 2014-10-25 MED ORDER — SODIUM CHLORIDE 0.9 % IR SOLN
Status: DC | PRN
  Administered 2014-10-25: 1000 mL

## 2014-10-25 MED ORDER — CEFAZOLIN SODIUM-DEXTROSE 2-3 GM-% IV SOLR
INTRAVENOUS | Status: DC | PRN
  Administered 2014-10-25: 2 g via INTRAVENOUS

## 2014-10-25 MED ORDER — PROMETHAZINE HCL 25 MG/ML IJ SOLN: 6.2500 mg | INTRAMUSCULAR | Status: DC | PRN

## 2014-10-25 MED ORDER — LIDOCAINE HCL (CARDIAC) 20 MG/ML IV SOLN
INTRAVENOUS | Status: DC | PRN
  Administered 2014-10-25: 60 mg via INTRAVENOUS

## 2014-10-25 MED ORDER — LACTATED RINGERS IV SOLN
INTRAVENOUS | Status: DC | PRN
  Administered 2014-10-25: 02:00:00 via INTRAVENOUS

## 2014-10-25 MED ORDER — PROPOFOL 10 MG/ML IV BOLUS
INTRAVENOUS | Status: DC | PRN
  Administered 2014-10-25: 140 mg via INTRAVENOUS

## 2014-10-25 MED ORDER — BUPIVACAINE HCL (PF) 0.25 % IJ SOLN
INTRAMUSCULAR | Status: AC
Start: 2014-10-25 — End: 2014-10-25
  Filled 2014-10-25: qty 30

## 2014-10-25 MED ORDER — BUPIVACAINE HCL (PF) 0.25 % IJ SOLN
INTRAMUSCULAR | Status: DC | PRN
  Administered 2014-10-25: 10 mL

## 2014-10-25 MED ORDER — HYDROMORPHONE HCL 1 MG/ML IJ SOLN: 0.2500 mg | INTRAMUSCULAR | Status: DC | PRN

## 2014-10-25 MED ORDER — FENTANYL CITRATE (PF) 250 MCG/5ML IJ SOLN
INTRAMUSCULAR | Status: AC
  Filled 2014-10-25: qty 5

## 2014-10-25 MED ORDER — FENTANYL CITRATE (PF) 250 MCG/5ML IJ SOLN
INTRAMUSCULAR | Status: DC | PRN
  Administered 2014-10-25: 50 ug via INTRAVENOUS

## 2014-10-25 MED ORDER — HYDROCODONE-ACETAMINOPHEN 5-325 MG PO TABS: ORAL_TABLET | ORAL | Status: DC

## 2014-10-25 MED ORDER — ONDANSETRON HCL 4 MG/2ML IJ SOLN
INTRAMUSCULAR | Status: DC | PRN
  Administered 2014-10-25: 4 mg via INTRAVENOUS

## 2014-10-25 MED ORDER — CEFAZOLIN SODIUM-DEXTROSE 2-3 GM-% IV SOLR
INTRAVENOUS | Status: AC
  Filled 2014-10-25: qty 50

## 2014-10-25 MED ORDER — MEPERIDINE HCL 25 MG/ML IJ SOLN: 6.2500 mg | INTRAMUSCULAR | Status: DC | PRN

## 2014-10-25 MED ORDER — SULFAMETHOXAZOLE-TRIMETHOPRIM 800-160 MG PO TABS: 1.0000 | ORAL_TABLET | Freq: Two times a day (BID) | ORAL | Status: DC

## 2014-10-25 SURGICAL SUPPLY — 50 items
BANDAGE COBAN STERILE 2 (GAUZE/BANDAGES/DRESSINGS) IMPLANT
BANDAGE ELASTIC 3 VELCRO ST LF (GAUZE/BANDAGES/DRESSINGS) ×3 IMPLANT
BANDAGE ELASTIC 4 VELCRO ST LF (GAUZE/BANDAGES/DRESSINGS) ×3 IMPLANT
BNDG COHESIVE 1X5 TAN STRL LF (GAUZE/BANDAGES/DRESSINGS) ×3 IMPLANT
BNDG CONFORM 2 STRL LF (GAUZE/BANDAGES/DRESSINGS) IMPLANT
BNDG ESMARK 4X9 LF (GAUZE/BANDAGES/DRESSINGS) IMPLANT
BNDG GAUZE ELAST 4 BULKY (GAUZE/BANDAGES/DRESSINGS) ×3 IMPLANT
CORDS BIPOLAR (ELECTRODE) ×3 IMPLANT
COVER SURGICAL LIGHT HANDLE (MISCELLANEOUS) ×3 IMPLANT
DECANTER SPIKE VIAL GLASS SM (MISCELLANEOUS) ×3 IMPLANT
DRAIN PENROSE 1/4X12 LTX STRL (WOUND CARE) IMPLANT
DRSG ADAPTIC 3X8 NADH LF (GAUZE/BANDAGES/DRESSINGS) IMPLANT
DRSG EMULSION OIL 3X3 NADH (GAUZE/BANDAGES/DRESSINGS) ×3 IMPLANT
DRSG PAD ABDOMINAL 8X10 ST (GAUZE/BANDAGES/DRESSINGS) ×6 IMPLANT
GAUZE SPONGE 4X4 12PLY STRL (GAUZE/BANDAGES/DRESSINGS) ×3 IMPLANT
GAUZE XEROFORM 1X8 LF (GAUZE/BANDAGES/DRESSINGS) ×3 IMPLANT
GLOVE BIO SURGEON STRL SZ7.5 (GLOVE) ×3 IMPLANT
GLOVE BIOGEL PI IND STRL 8 (GLOVE) ×1 IMPLANT
GLOVE BIOGEL PI INDICATOR 8 (GLOVE) ×2
GOWN STRL REUS W/ TWL LRG LVL3 (GOWN DISPOSABLE) ×1 IMPLANT
GOWN STRL REUS W/TWL LRG LVL3 (GOWN DISPOSABLE) ×2
KIT BASIN OR (CUSTOM PROCEDURE TRAY) ×3 IMPLANT
KIT ROOM TURNOVER OR (KITS) ×3 IMPLANT
LOOP VESSEL MAXI BLUE (MISCELLANEOUS) IMPLANT
LOOP VESSEL MINI RED (MISCELLANEOUS) IMPLANT
MANIFOLD NEPTUNE II (INSTRUMENTS) ×3 IMPLANT
NEEDLE HYPO 25X1 1.5 SAFETY (NEEDLE) IMPLANT
NS IRRIG 1000ML POUR BTL (IV SOLUTION) ×3 IMPLANT
PACK ORTHO EXTREMITY (CUSTOM PROCEDURE TRAY) ×3 IMPLANT
PAD ARMBOARD 7.5X6 YLW CONV (MISCELLANEOUS) ×3 IMPLANT
SCRUB BETADINE 4OZ XXX (MISCELLANEOUS) ×3 IMPLANT
SET CYSTO W/LG BORE CLAMP LF (SET/KITS/TRAYS/PACK) ×3 IMPLANT
SOLUTION BETADINE 4OZ (MISCELLANEOUS) ×3 IMPLANT
SPLINT FINGER (SOFTGOODS) ×3 IMPLANT
SPONGE LAP 18X18 X RAY DECT (DISPOSABLE) ×3 IMPLANT
SPONGE LAP 4X18 X RAY DECT (DISPOSABLE) ×3 IMPLANT
SUCTION FRAZIER TIP 10 FR DISP (SUCTIONS) IMPLANT
SUT CHROMIC 6 0 PS 4 (SUTURE) ×3 IMPLANT
SUT ETHILON 4 0 PS 2 18 (SUTURE) ×3 IMPLANT
SUT MON AB 5-0 P3 18 (SUTURE) ×3 IMPLANT
SYR CONTROL 10ML LL (SYRINGE) IMPLANT
TOWEL OR 17X24 6PK STRL BLUE (TOWEL DISPOSABLE) ×3 IMPLANT
TOWEL OR 17X26 10 PK STRL BLUE (TOWEL DISPOSABLE) ×3 IMPLANT
TUBE ANAEROBIC SPECIMEN COL (MISCELLANEOUS) IMPLANT
TUBE CONNECTING 12'X1/4 (SUCTIONS)
TUBE CONNECTING 12X1/4 (SUCTIONS) IMPLANT
TUBE FEEDING 5FR 15 INCH (TUBING) IMPLANT
UNDERPAD 30X30 INCONTINENT (UNDERPADS AND DIAPERS) ×3 IMPLANT
WATER STERILE IRR 1000ML POUR (IV SOLUTION) ×3 IMPLANT
YANKAUER SUCT BULB TIP NO VENT (SUCTIONS) IMPLANT

## 2014-10-25 NOTE — Op Note (Signed)
[id number] 

## 2014-10-25 NOTE — Brief Op Note (Signed)
10/24/2014 - 10/25/2014  2:33 AM  PATIENT:  Cory Conley  81 y.o. male  PRE-OPERATIVE DIAGNOSIS:  Right thumb open fracture and nail bed laceration  POST-OPERATIVE DIAGNOSIS:  Right thumb open fracture and nail bed laceration  PROCEDURE:  Procedure(s): IRRIGATION AND DEBRIDEMENT  with nailbed repair (Right)  SURGEON:  Surgeon(s) and Role:    * Leanora Cover, MD - Primary  PHYSICIAN ASSISTANT:   ASSISTANTS: none   ANESTHESIA:   general  EBL:     BLOOD ADMINISTERED:none  DRAINS: none   LOCAL MEDICATIONS USED:  MARCAINE     SPECIMEN:  No Specimen  DISPOSITION OF SPECIMEN:  N/A  COUNTS:  YES  TOURNIQUET:   Total Tourniquet Time Documented: Upper Arm (Right) - 19 minutes Total: Upper Arm (Right) - 19 minutes   DICTATION: .Other Dictation: Dictation Number [id number]  PLAN OF CARE: Discharge to home after PACU  PATIENT DISPOSITION:  PACU - hemodynamically stable.

## 2014-10-25 NOTE — H&P (Signed)
Cory Conley is an 81 y.o. male.   Chief Complaint: right thumb saw injury HPI: 81 yo lhd male states he injured left thumb on table saw evening of 10/24/14.  Seen at [facility] and sent to [facility] for further care.  Reports no previous injury to right thumb and no other injury at this time.  Past Medical History  Diagnosis Date  . Dyslipidemia   . History of BPH   . GERD (gastroesophageal reflux disease)   . Peptic ulcer disease   . ED (erectile dysfunction)   . Pulmonary nodule, right     RUL 5 mm-repeat CT 03/2013  . Chronic kidney disease     stage 3  . Hypertension   . Coronary artery disease     post stent X2 in 2003  . Pneumonia     1 year ago    Past Surgical History  Procedure Laterality Date  . Hip surgery    . Tonsillectomy    . Coronary angioplasty with stent placement      of LAD 05/2001, RCA 06/2001  . Colonoscopy      26yr 7/10  . Colonoscopy with propofol N/A 04/26/2014    Procedure: COLONOSCOPY WITH PROPOFOL;  Surgeon: MGarlan Fair MD;  Location: [facility];  Service: Endoscopy;  Laterality: N/A;    Family History  Problem Relation Age of Onset  . CVA Mother   . Peripheral vascular disease Father    Social History:  reports that he has quit smoking. His smoking use included Cigarettes. He does not have any smokeless tobacco history on file. He reports that he does not drink alcohol or use illicit drugs.  Allergies:  Allergies  Allergen Reactions  . Crestor [Rosuvastatin]     Myalgia  . Lipitor [Atorvastatin]     10 MG causes aches  . Niacin And Related     unknown  . Simvastatin     Myalgia   . Vytorin [Ezetimibe-Simvastatin]     Myalgia     (Not in a hospital admission)  Results for orders placed or performed during the hospital encounter of 10/24/14 (from the past 48 hour(s))  CBC with Differential     Status: None   Collection Time: 10/24/14  8:26 PM  Result Value Ref Range   WBC 8.7 4.0 - 10.5 K/uL   RBC 4.64 4.22 - 5.81 MIL/uL   Hemoglobin 13.7 13.0 - 17.0 g/dL   HCT 42.0 39.0 - 52.0 %   MCV 90.5 78.0 - 100.0 fL   MCH 29.5 26.0 - 34.0 pg   MCHC 32.6 30.0 - 36.0 g/dL   RDW 14.3 11.5 - 15.5 %   Platelets 186 150 - 400 K/uL   Neutrophils Relative % 76 43 - 77 %   Neutro Abs 6.6 1.7 - 7.7 K/uL   Lymphocytes Relative 12 12 - 46 %   Lymphs Abs 1.1 0.7 - 4.0 K/uL   Monocytes Relative 8 3 - 12 %   Monocytes Absolute 0.7 0.1 - 1.0 K/uL   Eosinophils Relative 4 0 - 5 %   Eosinophils Absolute 0.4 0.0 - 0.7 K/uL   Basophils Relative 0 0 - 1 %   Basophils Absolute 0.0 0.0 - 0.1 K/uL  Basic metabolic panel     Status: Abnormal   Collection Time: 10/24/14  8:26 PM  Result Value Ref Range   Sodium 138 135 - 145 mmol/L   Potassium 4.5 3.5 - 5.1 mmol/L   Chloride 106 101 -  111 mmol/L   CO2 25 22 - 32 mmol/L   Glucose, Bld 108 (H) 65 - 99 mg/dL   BUN 25 (H) 6 - 20 mg/dL   Creatinine, Ser 1.49 (H) 0.61 - 1.24 mg/dL   Calcium 9.4 8.9 - 10.3 mg/dL   GFR calc non Af Amer 43 (L) >60 mL/min   GFR calc Af Amer 50 (L) >60 mL/min    Comment: (NOTE) The eGFR has been calculated using the CKD EPI equation. This calculation has not been validated in all clinical situations. eGFR's persistently <60 mL/min signify possible Chronic Kidney Disease.    Anion gap 7 5 - 15    Dg Finger Thumb Right  10/24/2014   CLINICAL DATA:  Saw injury with laceration of the thumb.  EXAM: RIGHT THUMB 2+V  COMPARISON:  None.  FINDINGS: There is soft tissue deformity of the distal aspect of the thumb. There is an angular defect of the distal tuft consistent with mechanical saw injury. Some small bone fragments are seen in the soft tissues. No fracture extending to the joint. Ordinary mild osteoarthritis noted at the inner phalangeal joint.  IMPRESSION: Saw injury of the distal soft tissues and distal tuft.   Electronically Signed   By: Nelson Chimes M.D.   On: 10/24/2014 19:53     A comprehensive review of systems was negative.  Blood pressure 152/79,  pulse 59, temperature 97.6 F (36.4 C), temperature source Oral, resp. rate 16, SpO2 98 %.  General appearance: alert, cooperative and appears stated age Head: Normocephalic, without obvious abnormality, atraumatic Neck: supple, symmetrical, trachea midline Resp: clear to auscultation bilaterally Cardio: regular rate and rhythm GI: non tender Extremities: intact sensation and capillary refill all digits. +epl/fpl/io.   longitudinal wound on pad and into nail of right thumb.  no other wounds. Pulses: 2+ and symmetric Skin: Skin color, texture, turgor normal. No rashes or lesions Neurologic: Grossly normal Incision/Wound: As above  Assessment/Plan Right thumb saw injury with open fracture and nail injury.  Non operative and operative treatment options were discussed with the patient and patient wishes to proceed with operative treatment.  Risks, benefits, and alternatives of surgery were discussed and the patient agrees with the plan of care.   Safi Culotta R 10/25/2014, 1:18 AM

## 2014-10-25 NOTE — Anesthesia Preprocedure Evaluation (Addendum)
Anesthesia Evaluation  Patient identified by MRN, date of birth, ID band Patient awake    Reviewed: Allergy & Precautions, NPO status , Patient's Chart, lab work & pertinent test results  Airway Mallampati: II  TM Distance: >3 FB Neck ROM: Full    Dental no notable dental hx. (+) Partial Upper, Partial Lower   Pulmonary neg pulmonary ROS, pneumonia -, former smoker,  breath sounds clear to auscultation  Pulmonary exam normal       Cardiovascular hypertension, + CAD Normal cardiovascular examRhythm:Regular Rate:Normal     Neuro/Psych negative neurological ROS  negative psych ROS   GI/Hepatic negative GI ROS, Neg liver ROS, PUD, GERD-  ,  Endo/Other  negative endocrine ROS  Renal/GU Renal disease     Musculoskeletal negative musculoskeletal ROS (+)   Abdominal   Peds  Hematology negative hematology ROS (+)   Anesthesia Other Findings   Reproductive/Obstetrics                           Anesthesia Physical Anesthesia Plan  ASA: III and emergent  Anesthesia Plan: General   Post-op Pain Management:    Induction: Intravenous  Airway Management Planned: LMA and Oral ETT  Additional Equipment:   Intra-op Plan:   Post-operative Plan: Extubation in OR  Informed Consent: I have reviewed the patients History and Physical, chart, labs and discussed the procedure including the risks, benefits and alternatives for the proposed anesthesia with the patient or authorized representative who has indicated his/her understanding and acceptance.   Dental advisory given  Plan Discussed with: CRNA  Anesthesia Plan Comments:         Anesthesia Quick Evaluation                                  Anesthesia Evaluation  Patient identified by MRN, date of birth, ID band Patient awake    Reviewed: Allergy & Precautions, NPO status , Patient's Chart, lab work & pertinent test  results  Airway Mallampati: II  TM Distance: >3 FB Neck ROM: Full    Dental no notable dental hx.    Pulmonary pneumonia -, resolved, former smoker,  breath sounds clear to auscultation  Pulmonary exam normal       Cardiovascular hypertension, Pt. on medications + CAD Rhythm:Regular Rate:Normal     Neuro/Psych negative neurological ROS  negative psych ROS   GI/Hepatic Neg liver ROS, PUD, GERD-  ,  Endo/Other  negative endocrine ROS  Renal/GU Renal disease  negative genitourinary   Musculoskeletal negative musculoskeletal ROS (+)   Abdominal   Peds negative pediatric ROS (+)  Hematology negative hematology ROS (+)   Anesthesia Other Findings   Reproductive/Obstetrics negative OB ROS                            Anesthesia Physical Anesthesia Plan  ASA: III  Anesthesia Plan: MAC   Post-op Pain Management:    Induction: Intravenous  Airway Management Planned:   Additional Equipment:   Intra-op Plan:   Post-operative Plan:   Informed Consent: I have reviewed the patients History and Physical, chart, labs and discussed the procedure including the risks, benefits and alternatives for the proposed anesthesia with the patient or authorized representative who has indicated his/her understanding and acceptance.   Dental advisory given  Plan Discussed with: CRNA  Anesthesia Plan Comments:  Anesthesia Quick Evaluation

## 2014-10-25 NOTE — Discharge Instructions (Signed)

## 2014-10-25 NOTE — Anesthesia Procedure Notes (Signed)
Procedure Name: LMA Insertion Date/Time: 10/25/2014 1:55 AM Performed by: Valetta Fuller Pre-anesthesia Checklist: Patient identified, Emergency Drugs available, Suction available and Patient being monitored Patient Re-evaluated:Patient Re-evaluated prior to inductionOxygen Delivery Method: Circle system utilized Preoxygenation: Pre-oxygenation with 100% oxygen Intubation Type: IV induction LMA: LMA inserted LMA Size: 5.0 Number of attempts: 1 Tube secured with: Tape Dental Injury: Teeth and Oropharynx as per pre-operative assessment

## 2014-10-25 NOTE — Transfer of Care (Signed)
Immediate Anesthesia Transfer of Care Note  Patient: Cory Conley  Procedure(s) Performed: Procedure(s): IRRIGATION AND DEBRIDEMENT  with nailbed repair (Right)  Patient Location: PACU  Anesthesia Type:General  Level of Consciousness: awake, oriented and sedated  Airway & Oxygen Therapy: Patient connected to nasal cannula oxygen  Post-op Assessment: Report given to RN, Post -op Vital signs reviewed and stable and Patient moving all extremities X 4  Post vital signs: Reviewed and stable  Last Vitals:  Filed Vitals:   10/25/14 0105  BP: 152/79  Pulse: 59  Temp:   Resp: 16    Complications: No apparent anesthesia complications

## 2014-10-25 NOTE — Anesthesia Postprocedure Evaluation (Signed)
Anesthesia Post Note  Patient: Cory Conley  Procedure(s) Performed: Procedure(s) (LRB): IRRIGATION AND DEBRIDEMENT  with nailbed repair (Right)  Anesthesia type: General  Patient location: PACU  Post pain: Pain level controlled  Post assessment: Post-op Vital signs reviewed  Last Vitals: BP 141/75 mmHg  Pulse 59  Temp(Src) 36.4 C (Oral)  Resp 16  SpO2 100%  Post vital signs: Reviewed  Level of consciousness: sedated  Complications: No apparent anesthesia complications

## 2014-10-25 NOTE — Op Note (Signed)
NAMEKINGSLY, KLOEPFER NO.:  [id number]  MEDICAL RECORD NO.:  [id number]  LOCATION:  [facility]                         FACILITY:  [facility]  PHYSICIAN:  Leanora Cover, MD        DATE OF BIRTH:  05/20/1933  DATE OF PROCEDURE:  10/25/2014 DATE OF DISCHARGE:                              OPERATIVE REPORT   PREOPERATIVE DIAGNOSES:  Right thumb saw injury with open distal phalanx fracture and nail bed and skin lacerations.  POSTOPERATIVE DIAGNOSES:  Right thumb saw injury with open distal phalanx fracture and nail bed and skin lacerations.  PROCEDURE:   1. Right thumb irrigation and debridement of open fracture 2. Right thumb repair of skin and nail bed lacerations.  SURGEON:  Leanora Cover, MD.  ASSISTANT:  None.  ANESTHESIA:  General.  IV FLUIDS:  Per anesthesia flow sheet.  ESTIMATED BLOOD LOSS:  Minimal.  COMPLICATIONS:  None.  SPECIMENS:  None  TOURNIQUET TIME:  90 minutes.  DISPOSITION:  Stable to PACU.  INDICATIONS:  Mr. Memoli is a 81 year old left-hand dominant male, who yesterday evening injured his right thumb at a table saw.  He was brought to the emergency department and transferred to [facility] for further care.  On examination, he had intact sensation and capillary refill on the fingertip.  He had a longitudinal laceration to the skin and nail bed.  I recommended going to the operating room for irrigation and debridement of the open fracture and repair of skin and nail bed lacerations.  Risks, benefits, and alternatives of surgery were discussed including risk of blood loss; infection; damage to nerves, vessels, tendons, ligaments, bone; failure of surgery; need for additional surgery; complications with wound healing, continued pain, and nail deformity.  He voiced understanding of these risks and elected to proceed.  OPERATIVE COURSE:  After being identified preoperatively by myself, the patient and I agreed upon the procedure and site procedure.   Surgical site was marked.  The risks, benefits, and alternatives of the surgery were reviewed and he wished to proceed.  Surgical consent had been signed.  He was given IV Ancef as preoperative antibiotic prophylaxis. His tetanus had been updated by the emergency department.  He was transferred to the operating room and placed on the operating room table in supine position with the right upper extremity on an armboard. General anesthesia was induced by Anesthesiology.  Right upper extremity was prepped and draped in a normal sterile orthopedic fashion.  A surgical pause was performed between surgeons, anesthesia and operating staff, and all were in agreement as to the patient, procedure, site procedure.  Tourniquet at the proximal aspect of the extremity was inflated to 250 mmHg after exsanguination with an Esmarch bandage.  The nail was removed.  The laceration and bone were cleared of clot and debrided.  There was no gross contamination.  The wound was copiously irrigated with sterile saline by bulb syringe.  The skin was repaired with 5-0 Monocryl in an interrupted fashion.  The nail bed was repaired with 6-0 chromic suture in an interrupted fashion.  This provided good apposition of the soft tissues.  A piece of Xeroform was placed in the  nail fold.  The nail had been removed with a Soil scientist at the start of the case.  All wounds were dressed with sterile Xeroform, 4x4s, and wrapped with a Coban dressing lightly.  An AlumaFoam splint was placed and wrapped lightly with a Coban dressing.  A digital block was performed.  A 10 mL of 0.25% plain Marcaine to aid in postoperative analgesia.  The tourniquet was deflated at 90 minutes.  Fingertips were pink with brisk capillary refill after deflation of the tourniquet. Operative drapes were broken down.  The patient was awoken from anesthesia safely.  He was transferred back to stretcher and taken to PACU in stable condition.  I will see  him back in the office in 1 week for postoperative followup.  I will give him Norco 5/325, 1-2 p.o. q.6 hours p.r.n. pain, dispensed #30 and Bactrim DS 1 p.o. b.i.d. x7 days.     Leanora Cover, MD     KK/MEDQ  D:  10/25/2014  T:  10/25/2014  Job:  [id number]

## 2014-10-26 ENCOUNTER — Encounter (HOSPITAL_COMMUNITY): Payer: Self-pay | Admitting: Orthopedic Surgery

## 2015-03-29 ENCOUNTER — Other Ambulatory Visit: Payer: Self-pay | Admitting: Family Medicine

## 2015-03-29 DIAGNOSIS — R911 Solitary pulmonary nodule: Secondary | ICD-10-CM

## 2015-04-05 ENCOUNTER — Other Ambulatory Visit: Payer: Medicare Other

## 2015-04-07 ENCOUNTER — Ambulatory Visit
Admission: RE | Admit: 2015-04-07 | Discharge: 2015-04-07 | Disposition: A | Discharge status: Home / Self Care | Payer: Medicare Other | Source: Ambulatory Visit | Attending: Family Medicine | Admitting: Family Medicine

## 2015-04-07 DIAGNOSIS — R911 Solitary pulmonary nodule: Secondary | ICD-10-CM

## 2015-04-17 ENCOUNTER — Other Ambulatory Visit: Payer: Self-pay | Admitting: Cardiology

## 2015-04-19 ENCOUNTER — Other Ambulatory Visit: Payer: Self-pay | Admitting: Cardiology

## 2015-09-15 ENCOUNTER — Encounter: Payer: Self-pay | Admitting: Cardiology

## 2015-09-15 ENCOUNTER — Ambulatory Visit (INDEPENDENT_AMBULATORY_CARE_PROVIDER_SITE_OTHER): Payer: Medicare Other | Admitting: Cardiology

## 2015-09-15 VITALS — BP 134/76 | HR 64 | Ht 68.0 in | Wt 190.8 lb

## 2015-09-15 DIAGNOSIS — I10 Essential (primary) hypertension: Secondary | ICD-10-CM

## 2015-09-15 DIAGNOSIS — I2510 Atherosclerotic heart disease of native coronary artery without angina pectoris: Secondary | ICD-10-CM | POA: Diagnosis not present

## 2015-09-15 DIAGNOSIS — E785 Hyperlipidemia, unspecified: Secondary | ICD-10-CM | POA: Diagnosis not present

## 2015-09-15 DIAGNOSIS — I2583 Coronary atherosclerosis due to lipid rich plaque: Principal | ICD-10-CM

## 2015-09-15 NOTE — Progress Notes (Signed)
Cardiology Office Note    Date:  09/15/2015   ID:  Cory Conley, DOB [birth year], MRN [id number]  PCP:  Sheela Stack, MD  Cardiologist:  Fransico Him, MD   Chief Complaint  Patient presents with  . Coronary Artery Disease  . Hypertension  . Hyperlipidemia    History of Present Illness:  Cory Conley is a 82 y.o. male with a history of ASCAD s/p PCI x 2 in 2003, dyslipdemia and HTN who presents today for followup. He is doing well. He denies any chest pain, SOB, DOE, dizziness, palpitations or syncope. He occasionally has some LE edema at the end of the day.     Past Medical History  Diagnosis Date  . Dyslipidemia   . History of BPH   . GERD (gastroesophageal reflux disease)   . Peptic ulcer disease   . ED (erectile dysfunction)   . Pulmonary nodule, right     RUL 5 mm-repeat CT 03/2013  . Chronic kidney disease     stage 3  . Hypertension   . Coronary artery disease     post stent X2 in 2003  . Pneumonia     1 year ago    Past Surgical History  Procedure Laterality Date  . Hip surgery    . Tonsillectomy    . Coronary angioplasty with stent placement      of LAD 05/2001, RCA 06/2001  . Colonoscopy      32yrs 7/10  . Colonoscopy with propofol N/A 04/26/2014    Procedure: COLONOSCOPY WITH PROPOFOL;  Surgeon: Garlan Fair, MD;  Location: [facility];  Service: Endoscopy;  Laterality: N/A;  . I&d extremity Right 10/25/2014    Procedure: IRRIGATION AND DEBRIDEMENT  with nailbed repair;  Surgeon: Leanora Cover, MD;  Location: [facility];  Service: Orthopedics;  Laterality: Right;    Current Medications: Outpatient Prescriptions Prior to Visit  Medication Sig Dispense Refill  . amLODipine (NORVASC) 10 MG tablet Take 10 mg by mouth every morning.     Marland Kitchen aspirin 81 MG tablet Take 1 tablet (81 mg total) by mouth daily.    Marland Kitchen atorvastatin (LIPITOR) 20 MG tablet TAKE 1 TABLET(20 MG) BY MOUTH DAILY 30 tablet 5  . Coenzyme Q10 (COQ10) 200 MG CAPS Take 200 mg by mouth  daily.    Marland Kitchen ibuprofen (ADVIL,MOTRIN) 200 MG tablet Take 400 mg by mouth every 6 (six) hours as needed for pain or fever.     . Multiple Vitamin (MULTIVITAMIN WITH MINERALS) TABS Take 1 tablet by mouth daily.    Marland Kitchen omeprazole (PRILOSEC) 20 MG capsule Take 20 mg by mouth daily.    . silodosin (RAPAFLO) 8 MG CAPS capsule Take 8 mg by mouth daily with breakfast.    . HYDROcodone-acetaminophen (NORCO) 5-325 MG per tablet 1-2 tabs po q6 hours prn pain 30 tablet 0  . ramipril (ALTACE) 5 MG tablet Take 5 mg by mouth every morning.     . sulfamethoxazole-trimethoprim (BACTRIM DS) 800-160 MG per tablet Take 1 tablet by mouth 2 (two) times daily. (Patient not taking: Reported on 09/15/2015) 14 tablet 0   No facility-administered medications prior to visit.     Allergies:   Crestor; Lipitor; Niacin and related; Simvastatin; and Vytorin   Social History   Social History  . Marital Status: Married    Spouse Name: N/A  . Number of Children: N/A  . Years of Education: N/A   Social History Main Topics  . Smoking status: Former  Smoker    Types: Cigarettes  . Smokeless tobacco: None     Comment: quit in 1984  . Alcohol Use: No  . Drug Use: No  . Sexual Activity: Not Asked   Other Topics Concern  . None   Social History Narrative     Family History:  The patient's family history includes CVA in his mother; Peripheral vascular disease in his father.   ROS:   Please see the history of present illness.    Review of Systems  Respiratory: Positive for cough.    All other systems reviewed and are negative.   PHYSICAL EXAM:   VS:  BP 134/76 mmHg  Pulse 64  Ht 5\' 8"  (1.727 m)  Wt 190 lb 12.8 oz (86.546 kg)  BMI 29.02 kg/m2  SpO2 97%   GEN: Well nourished, well developed, in no acute distress HEENT: normal Neck: no JVD, carotid bruits, or masses Cardiac: RRR; no murmurs, rubs, or gallops,no edema.  Intact distal pulses bilaterally.  Respiratory:  clear to auscultation bilaterally, normal  work of breathing GI: soft, nontender, nondistended, + BS MS: no deformity or atrophy Skin: warm and dry, no rash Neuro:  Alert and Oriented x 3, Strength and sensation are intact Psych: euthymic mood, full affect  Wt Readings from Last 3 Encounters:  09/15/15 190 lb 12.8 oz (86.546 kg)  09/05/14 188 lb 12.8 oz (85.639 kg)  08/31/13 189 lb (85.73 kg)      Studies/Labs Reviewed:   EKG:  EKG is not ordered today.    Recent Labs: 10/17/2014: ALT 22 10/24/2014: BUN 25*; Creatinine, Ser 1.49*; Hemoglobin 13.7; Platelets 186; Potassium 4.5; Sodium 138   Lipid Panel    Component Value Date/Time   CHOL 113 10/17/2014 0852   TRIG 108.0 10/17/2014 0852   HDL 33.00* 10/17/2014 0852   CHOLHDL 3 10/17/2014 0852   VLDL 21.6 10/17/2014 0852   LDLCALC 58 10/17/2014 0852    Additional studies/ records that were reviewed today include:  none    ASSESSMENT:    1. Coronary artery disease due to lipid rich plaque   2. Essential hypertension   3. Dyslipidemia      PLAN:  In order of problems listed above:  1. ASCAD s/p PCI of RCA and LAD in 2003 with no angina.  Continue ASA/statin.   2. HTN - BP well controlled.  Continue amlodipine/ACE I.   3. Dyslipidemia - LDL goal < 70.  Continue statin.  Check FLP and ALT.    Medication Adjustments/Labs and Tests Ordered: Current medicines are reviewed at length with the patient today.  Concerns regarding medicines are outlined above.  Medication changes, Labs and Tests ordered today are listed in the Patient Instructions below.  There are no Patient Instructions on file for this visit.   Signed, Fransico Him, MD  09/15/2015 9:43 AM    [facility] [address], [place], [place]  [address] Phone: [phone]; Fax: [phone]

## 2015-09-15 NOTE — Patient Instructions (Signed)
Medication Instructions:  Your physician recommends that you continue on your current medications as directed. Please refer to the Current Medication list given to you today.   Labwork: Your physician recommends that you return for fasting lab work.   Testing/Procedures: None  Follow-Up: Your physician wants you to follow-up in: 1 year with Dr. Radford Pax. You will receive a reminder letter in the mail two months in advance. If you don't receive a letter, please call our office to schedule the follow-up appointment.   Any Other Special Instructions Will Be Listed Below (If Applicable).     If you need a refill on your cardiac medications before your next appointment, please call your pharmacy.

## 2015-09-22 ENCOUNTER — Other Ambulatory Visit (INDEPENDENT_AMBULATORY_CARE_PROVIDER_SITE_OTHER): Payer: Medicare Other | Admitting: *Deleted

## 2015-09-22 DIAGNOSIS — E785 Hyperlipidemia, unspecified: Secondary | ICD-10-CM | POA: Diagnosis not present

## 2015-09-22 LAB — HEPATIC FUNCTION PANEL
ALK PHOS: 84 U/L (ref 40–115)
ALT: 26 U/L (ref 9–46)
AST: 23 U/L (ref 10–35)
Albumin: 4.4 g/dL (ref 3.6–5.1)
BILIRUBIN TOTAL: 0.6 mg/dL (ref 0.2–1.2)
Bilirubin, Direct: 0.1 mg/dL (ref <=0.2)
Indirect Bilirubin: 0.5 mg/dL (ref 0.2–1.2)
Total Protein: 6.5 g/dL (ref 6.1–8.1)

## 2015-09-22 LAB — LIPID PANEL
CHOL/HDL RATIO: 3.1 ratio (ref <=5.0)
CHOLESTEROL: 119 mg/dL — AB (ref 125–200)
HDL: 39 mg/dL — AB (ref >=40)
LDL Cholesterol: 56 mg/dL (ref <130)
Triglycerides: 119 mg/dL (ref <150)
VLDL: 24 mg/dL (ref <30)

## 2015-09-22 NOTE — Addendum Note (Signed)
Addended by: Eulis Foster on: 09/22/2015 08:20 AM   Modules accepted: Orders

## 2015-09-25 ENCOUNTER — Telehealth: Payer: Self-pay | Admitting: Cardiology

## 2015-09-25 NOTE — Telephone Encounter (Signed)
Informed patient of results and verbal understanding expressed.  

## 2015-09-25 NOTE — Telephone Encounter (Signed)
-----   Message from Sueanne Margarita, MD sent at 09/22/2015  3:55 PM EDT ----- Lipids at goal continue current therapy

## 2015-09-25 NOTE — Telephone Encounter (Signed)
New message   Pt is calling he verbalized that he is returning call to Dr.Turners rn

## 2015-10-18 ENCOUNTER — Other Ambulatory Visit: Payer: Self-pay | Admitting: Cardiology

## 2016-10-11 ENCOUNTER — Other Ambulatory Visit: Payer: Self-pay | Admitting: Cardiology

## 2017-01-14 ENCOUNTER — Other Ambulatory Visit: Payer: Self-pay | Admitting: Cardiology

## 2017-02-12 ENCOUNTER — Other Ambulatory Visit: Payer: Self-pay | Admitting: Cardiology

## 2017-03-14 ENCOUNTER — Other Ambulatory Visit: Payer: Self-pay | Admitting: Cardiology

## 2017-04-01 ENCOUNTER — Other Ambulatory Visit: Payer: Self-pay | Admitting: Cardiology

## 2017-04-16 ENCOUNTER — Encounter (HOSPITAL_COMMUNITY): Payer: Self-pay

## 2017-04-16 ENCOUNTER — Other Ambulatory Visit: Payer: Self-pay

## 2017-04-16 ENCOUNTER — Emergency Department (HOSPITAL_COMMUNITY)
Admission: EM | Admit: 2017-04-16 | Discharge: 2017-04-17 | Disposition: A | Discharge status: Home / Self Care | Payer: Medicare Other | Attending: Emergency Medicine | Admitting: Emergency Medicine

## 2017-04-16 ENCOUNTER — Emergency Department (HOSPITAL_COMMUNITY): Payer: Medicare Other

## 2017-04-16 DIAGNOSIS — I129 Hypertensive chronic kidney disease with stage 1 through stage 4 chronic kidney disease, or unspecified chronic kidney disease: Secondary | ICD-10-CM | POA: Insufficient documentation

## 2017-04-16 DIAGNOSIS — J189 Pneumonia, unspecified organism: Secondary | ICD-10-CM

## 2017-04-16 DIAGNOSIS — R05 Cough: Secondary | ICD-10-CM | POA: Diagnosis present

## 2017-04-16 DIAGNOSIS — J181 Lobar pneumonia, unspecified organism: Secondary | ICD-10-CM | POA: Insufficient documentation

## 2017-04-16 DIAGNOSIS — N183 Chronic kidney disease, stage 3 (moderate): Secondary | ICD-10-CM | POA: Insufficient documentation

## 2017-04-16 DIAGNOSIS — I2510 Atherosclerotic heart disease of native coronary artery without angina pectoris: Secondary | ICD-10-CM | POA: Insufficient documentation

## 2017-04-16 DIAGNOSIS — Z79899 Other long term (current) drug therapy: Secondary | ICD-10-CM | POA: Insufficient documentation

## 2017-04-16 DIAGNOSIS — Z7982 Long term (current) use of aspirin: Secondary | ICD-10-CM | POA: Insufficient documentation

## 2017-04-16 DIAGNOSIS — Z87891 Personal history of nicotine dependence: Secondary | ICD-10-CM | POA: Diagnosis not present

## 2017-04-16 MED ORDER — ALBUTEROL SULFATE (2.5 MG/3ML) 0.083% IN NEBU: 5.0000 mg | INHALATION_SOLUTION | Freq: Once | RESPIRATORY_TRACT | Status: DC

## 2017-04-16 NOTE — ED Triage Notes (Signed)
Pt arrives with c/o coughing spells for over a week; Pt a&ox 4 on arrival. Pt denies medical hx or COPD; Pt states hx of Pneumonia;Pt denies pain or SOB on arrival. Family states patient has been tired lately but patient denies and states he feel fine;-Monique,RN

## 2017-04-17 MED ORDER — DOXYCYCLINE HYCLATE 100 MG PO TABS
100.0000 mg | ORAL_TABLET | Freq: Once | ORAL | Status: AC
  Administered 2017-04-17: 100 mg via ORAL
  Filled 2017-04-17: qty 1

## 2017-04-17 MED ORDER — DOXYCYCLINE HYCLATE 100 MG PO CAPS: 100.0000 mg | ORAL_CAPSULE | Freq: Two times a day (BID) | ORAL | 0 refills | Status: DC

## 2017-04-17 MED ORDER — BENZONATATE 100 MG PO CAPS
200.0000 mg | ORAL_CAPSULE | Freq: Once | ORAL | Status: AC
  Administered 2017-04-17: 200 mg via ORAL
  Filled 2017-04-17: qty 2

## 2017-04-17 MED ORDER — BENZONATATE 100 MG PO CAPS: 200.0000 mg | ORAL_CAPSULE | Freq: Two times a day (BID) | ORAL | 0 refills | Status: DC | PRN

## 2017-04-17 NOTE — ED Provider Notes (Signed)
[facility] EMERGENCY DEPARTMENT Provider Note   CSN: [id number] Arrival date & time: 04/16/17  2020     History   Chief Complaint Chief Complaint  Patient presents with  . Cough    HPI Cory Conley is a 83 y.o. male.  83 year old male with past medical history below who presents with cough.  Patient has had 1 week of cough that was initially associated with sore throat and was productive, now is nonproductive but persistent and sometimes worse at night.  He denies any associated fevers, shortness of breath, or chest pain.  No sick contacts.  No medications prior to arrival.  Son states that he does have a history of pneumonia.  He is a non-smoker with no history of lung problems.   The history is provided by the patient.    Past Medical History:  Diagnosis Date  . Chronic kidney disease    stage 3  . Coronary artery disease    post stent X2 in 2003  . Dyslipidemia   . ED (erectile dysfunction)   . GERD (gastroesophageal reflux disease)   . History of BPH   . Hypertension   . Peptic ulcer disease   . Pneumonia    1 year ago  . Pulmonary nodule, right    RUL 5 mm-repeat CT 03/2013    Patient Active Problem List   Diagnosis Date Noted  . Sinus bradycardia 08/31/2013  . Hypertension   . Coronary artery disease   . Dyslipidemia     Past Surgical History:  Procedure Laterality Date  . COLONOSCOPY     83yrs 7/10  . COLONOSCOPY WITH PROPOFOL N/A 04/26/2014   Procedure: COLONOSCOPY WITH PROPOFOL;  Surgeon: Garlan Fair, MD;  Location: [facility];  Service: Endoscopy;  Laterality: N/A;  . CORONARY ANGIOPLASTY WITH STENT PLACEMENT     of LAD 05/2001, RCA 06/2001  . HIP SURGERY    . I&D EXTREMITY Right 10/25/2014   Procedure: IRRIGATION AND DEBRIDEMENT  with nailbed repair;  Surgeon: Leanora Cover, MD;  Location: [facility];  Service: Orthopedics;  Laterality: Right;  . TONSILLECTOMY         Home Medications    Prior to Admission medications     Medication Sig Start Date End Date Taking? Authorizing Provider  amLODipine (NORVASC) 10 MG tablet Take 10 mg by mouth every morning.     [provider]  aspirin 81 MG tablet Take 1 tablet (81 mg total) by mouth daily. 08/31/13   Sueanne Margarita, MD  atorvastatin (LIPITOR) 20 MG tablet TAKE 1 TABLET BY MOUTH DAILY 04/02/17   Sueanne Margarita, MD  benzonatate (TESSALON) 100 MG capsule Take 2 capsules (200 mg total) by mouth 2 (two) times daily as needed for cough. 04/17/17   Little, Wenda Overland, MD  Coenzyme Q10 (COQ10) 200 MG CAPS Take 200 mg by mouth daily.    [provider]  doxycycline (VIBRAMYCIN) 100 MG capsule Take 1 capsule (100 mg total) by mouth 2 (two) times daily. 04/17/17   Little, Wenda Overland, MD  ibuprofen (ADVIL,MOTRIN) 200 MG tablet Take 400 mg by mouth every 6 (six) hours as needed for pain or fever.     [provider]  Multiple Vitamin (MULTIVITAMIN WITH MINERALS) TABS Take 1 tablet by mouth daily.    [provider]  omeprazole (PRILOSEC) 20 MG capsule Take 20 mg by mouth daily.    [provider]  ramipril (ALTACE) 10 MG capsule Take 1  capsule by mouth daily. Pt takes 8 mg daily 07/28/15   [provider]  silodosin (RAPAFLO) 8 MG CAPS capsule Take 8 mg by mouth daily with breakfast.    [provider]    Family History Family History  Problem Relation Age of Onset  . CVA Mother   . Peripheral vascular disease Father     Social History Social History   Tobacco Use  . Smoking status: Former Smoker    Types: Cigarettes  . Tobacco comment: quit in 1984  Substance Use Topics  . Alcohol use: No  . Drug use: No     Allergies   Crestor [rosuvastatin]; Lipitor [atorvastatin]; Niacin and related; Simvastatin; and Vytorin [ezetimibe-simvastatin]   Review of Systems Review of Systems All other systems reviewed and are negative except that which was mentioned in HPI  Physical Exam Updated Vital  Signs BP (!) 142/85   Pulse (!) 50   Temp 97.8 F (36.6 C) (Oral)   Resp 18   Ht 5\' 10"  (1.778 m)   Wt 86.2 kg (190 lb)   SpO2 99%   BMI 27.26 kg/m   Physical Exam  Constitutional: He is oriented to person, place, and time. He appears well-developed and well-nourished. No distress.  HENT:  Head: Normocephalic and atraumatic.  Moist mucous membranes  Eyes: Conjunctivae are normal.  Neck: Neck supple.  Cardiovascular: Normal rate, regular rhythm and normal heart sounds.  No murmur heard. Pulmonary/Chest: Effort normal. No respiratory distress. He has no wheezes.  Mildly diminished breath sounds RLL  Abdominal: Soft. Bowel sounds are normal. He exhibits no distension. There is no tenderness.  Musculoskeletal: He exhibits no edema.  Neurological: He is alert and oriented to person, place, and time.  Fluent speech  Skin: Skin is warm and dry.  Psychiatric: He has a normal mood and affect. Judgment normal.  Nursing note and vitals reviewed.    ED Treatments / Results  Labs (all labs ordered are listed, but only abnormal results are displayed) Labs Reviewed - No data to display  EKG  EKG Interpretation  Date/Time:  Wednesday April 16 2017 20:47:15 EST Ventricular Rate:  50 PR Interval:  216 QRS Duration: 108 QT Interval:  448 QTC Calculation: 408 R Axis:   6 Text Interpretation:  Sinus bradycardia with 1st degree A-V block Otherwise normal ECG No significant change since last tracing Confirmed by Theotis Burrow [phone]) on 04/17/2017 12:08:58 AM       Radiology Dg Chest 2 View  Result Date: 04/16/2017 CLINICAL DATA:  Acute onset of productive cough. EXAM: CHEST  2 VIEW COMPARISON:  Chest radiograph performed 06/27/2014, and CT of the chest performed 04/07/2015 FINDINGS: A 2.5 cm nodular opacity is noted near the right lung base. This could reflect pneumonia, given the patient's symptoms, but a mass cannot be excluded. No pleural effusion or pneumothorax is seen.  The heart is normal in size. No acute osseous abnormalities are identified. IMPRESSION: 2.5 cm nodular opacity near the right lung base. This could reflect pneumonia, but a mass cannot be excluded. If the patient's symptoms are more suspicious for pneumonia, followup PA and lateral chest X-ray is recommended in 3-4 weeks following trial of antibiotic therapy to ensure resolution and exclude underlying malignancy. Otherwise, CT of the chest could be performed for further evaluation. Electronically Signed   By: Garald Balding M.D.   On: 04/16/2017 21:13    Procedures Procedures (including critical care time)  Medications Ordered in ED Medications  albuterol (PROVENTIL) (  2.5 MG/3ML) 0.083% nebulizer solution 5 mg (5 mg Nebulization Not Given 04/17/17 0034)  doxycycline (VIBRA-TABS) tablet 100 mg (not administered)  benzonatate (TESSALON) capsule 200 mg (not administered)     Initial Impression / Assessment and Plan / ED Course  I have reviewed the triage vital signs and the nursing notes.  Pertinent imaging results that were available during my care of the patient were reviewed by me and considered in my medical decision making (see chart for details).     1 week cough, no SOB or fevers, well appearing on exam, normal VS including normal O2 sat. CXR shows RLL opacity, suspect due to acquired pneumonia given history.  I recommended PCP follow-up after antibiotic treatment for repeat chest x-ray and explained that he will need CT scan of this area does not resolve.  Gave doxycycline and discussed supportive measures.  Return precautions reviewed and patient discharged in satisfactory condition.  Final Clinical Impressions(s) / ED Diagnoses   Final diagnoses:  Community acquired pneumonia of right lower lobe of lung [facility])    ED Discharge Orders        Ordered    benzonatate (TESSALON) 100 MG capsule  2 times daily PRN     04/17/17 0046    doxycycline (VIBRAMYCIN) 100 MG capsule  2 times  daily     04/17/17 0046       Little, Wenda Overland, MD 04/17/17 [phone]

## 2017-04-17 NOTE — Discharge Instructions (Signed)
FOLLOW UP WITH YOUR PRIMARY CARE DOCTOR TO HAVE REPEAT CHEST X-RAY IN 3-4 WEEKS. TAKE ALL ANTIBIOTICS UNTIL FINISHED. RETURN TO ER IF ANY SHORTNESS OF BREATH, CHEST PAIN, FEVERS, OR WORSENING SYMPTOMS.

## 2017-06-01 ENCOUNTER — Other Ambulatory Visit: Payer: Self-pay | Admitting: Cardiology

## 2017-06-05 ENCOUNTER — Ambulatory Visit: Payer: Medicare Other | Admitting: Cardiology

## 2017-06-09 ENCOUNTER — Ambulatory Visit: Payer: Medicare Other | Admitting: Cardiology

## 2017-07-01 ENCOUNTER — Other Ambulatory Visit: Payer: Self-pay | Admitting: Cardiology

## 2017-08-02 ENCOUNTER — Other Ambulatory Visit: Payer: Self-pay | Admitting: Cardiology

## 2017-08-04 ENCOUNTER — Other Ambulatory Visit: Payer: Self-pay | Admitting: Cardiology

## 2017-08-22 ENCOUNTER — Encounter (INDEPENDENT_AMBULATORY_CARE_PROVIDER_SITE_OTHER): Payer: Self-pay

## 2017-08-22 ENCOUNTER — Ambulatory Visit: Payer: Medicare Other | Admitting: Cardiology

## 2017-08-22 ENCOUNTER — Encounter: Payer: Self-pay | Admitting: Cardiology

## 2017-08-22 VITALS — BP 100/72 | HR 45 | Ht 70.0 in | Wt 192.0 lb

## 2017-08-22 DIAGNOSIS — I10 Essential (primary) hypertension: Secondary | ICD-10-CM | POA: Diagnosis not present

## 2017-08-22 DIAGNOSIS — I2510 Atherosclerotic heart disease of native coronary artery without angina pectoris: Secondary | ICD-10-CM

## 2017-08-22 DIAGNOSIS — R001 Bradycardia, unspecified: Secondary | ICD-10-CM | POA: Diagnosis not present

## 2017-08-22 DIAGNOSIS — E785 Hyperlipidemia, unspecified: Secondary | ICD-10-CM | POA: Diagnosis not present

## 2017-08-22 LAB — LIPID PANEL
CHOL/HDL RATIO: 3 ratio (ref 0.0–5.0)
Cholesterol, Total: 116 mg/dL (ref 100–199)
HDL: 39 mg/dL — ABNORMAL LOW (ref >39)
LDL Calculated: 61 mg/dL (ref 0–99)
Triglycerides: 79 mg/dL (ref 0–149)
VLDL Cholesterol Cal: 16 mg/dL (ref 5–40)

## 2017-08-22 LAB — HEPATIC FUNCTION PANEL
ALT: 28 IU/L (ref 0–44)
AST: 24 IU/L (ref 0–40)
Albumin: 4.6 g/dL (ref 3.5–4.7)
Alkaline Phosphatase: 89 IU/L (ref 39–117)
BILIRUBIN TOTAL: 0.7 mg/dL (ref 0.0–1.2)
BILIRUBIN, DIRECT: 0.25 mg/dL (ref 0.00–0.40)
Total Protein: 6.7 g/dL (ref 6.0–8.5)

## 2017-08-22 NOTE — Progress Notes (Signed)
Cardiology Office Note:    Date:  08/22/2017   ID:  Cory Conley, DOB 05-27-1933, MRN [id number]  PCP:  Reynold Bowen, MD  Cardiologist:  No primary care provider on file.    Referring MD: Reynold Bowen, MD   Chief Complaint  Patient presents with  . Coronary Artery Disease  . Hypertension  . Hyperlipidemia    History of Present Illness:    Cory Conley is a 84 y.o. male with a hx of ASCAD s/p PCI x 2 in 2003, dyslipdemia and HTN.  He is here today for followup and is doing well.  He denies any chest pain or pressure, SOB, DOE, PND, orthopnea, LE edema, dizziness, palpitations or syncope. He is compliant with his meds and is tolerating meds with no SE.    Past Medical History:  Diagnosis Date  . Chronic kidney disease    stage 3  . Coronary artery disease    post stent X2 in 2003  . Dyslipidemia   . ED (erectile dysfunction)   . GERD (gastroesophageal reflux disease)   . History of BPH   . Hypertension   . Peptic ulcer disease   . Pneumonia    1 year ago  . Pulmonary nodule, right    RUL 5 mm-repeat CT 03/2013    Past Surgical History:  Procedure Laterality Date  . COLONOSCOPY     52yrs 7/10  . COLONOSCOPY WITH PROPOFOL N/A 04/26/2014   Procedure: COLONOSCOPY WITH PROPOFOL;  Surgeon: Garlan Fair, MD;  Location: [facility];  Service: Endoscopy;  Laterality: N/A;  . CORONARY ANGIOPLASTY WITH STENT PLACEMENT     of LAD 05/2001, RCA 06/2001  . HIP SURGERY    . I&D EXTREMITY Right 10/25/2014   Procedure: IRRIGATION AND DEBRIDEMENT  with nailbed repair;  Surgeon: Leanora Cover, MD;  Location: [facility];  Service: Orthopedics;  Laterality: Right;  . TONSILLECTOMY      Current Medications: Current Meds  Medication Sig  . amLODipine (NORVASC) 10 MG tablet Take 10 mg by mouth every morning.   Marland Kitchen aspirin 81 MG tablet Take 1 tablet (81 mg total) by mouth daily.  Marland Kitchen atorvastatin (LIPITOR) 20 MG tablet Take 1 tablet (20 mg total) by mouth daily.  . benzonatate (TESSALON) 100  MG capsule Take 2 capsules (200 mg total) by mouth 2 (two) times daily as needed for cough.  . BESIVANCE 0.6 % SUSP Place 1 drop into the left eye 3 (three) times daily.  . Coenzyme Q10 (COQ10) 200 MG CAPS Take 200 mg by mouth daily.  Marland Kitchen doxycycline (VIBRAMYCIN) 100 MG capsule Take 1 capsule (100 mg total) by mouth 2 (two) times daily.  . DUREZOL 0.05 % EMUL Place 1 drop into the left eye 3 (three) times daily.  Marland Kitchen ibuprofen (ADVIL,MOTRIN) 200 MG tablet Take 400 mg by mouth every 6 (six) hours as needed for pain or fever.   . latanoprost (XALATAN) 0.005 % ophthalmic solution Place 1 drop into both eyes daily.  . Multiple Vitamin (MULTIVITAMIN WITH MINERALS) TABS Take 1 tablet by mouth daily.  Marland Kitchen omeprazole (PRILOSEC) 20 MG capsule Take 20 mg by mouth daily.  Marland Kitchen PROLENSA 0.07 % SOLN Place 1 drop into the left eye at bedtime.  . ramipril (ALTACE) 10 MG capsule Take 1 capsule by mouth daily. Pt takes 8 mg daily  . silodosin (RAPAFLO) 8 MG CAPS capsule Take 8 mg by mouth daily with breakfast.  . timolol (TIMOPTIC) 0.5 % ophthalmic solution Place 1  drop into both eyes 2 (two) times daily.     Allergies:   Crestor [rosuvastatin]; Lipitor [atorvastatin]; Niacin and related; Simvastatin; and Vytorin [ezetimibe-simvastatin]   Social History   Socioeconomic History  . Marital status: Married    Spouse name: Not on file  . Number of children: Not on file  . Years of education: Not on file  . Highest education level: Not on file  Occupational History  . Not on file  Social Needs  . Financial resource strain: Not on file  . Food insecurity:    Worry: Not on file    Inability: Not on file  . Transportation needs:    Medical: Not on file    Non-medical: Not on file  Tobacco Use  . Smoking status: Former Smoker    Types: Cigarettes  . Smokeless tobacco: Never Used  . Tobacco comment: quit in 1984  Substance and Sexual Activity  . Alcohol use: No  . Drug use: No  . Sexual activity: Not on file    Lifestyle  . Physical activity:    Days per week: Not on file    Minutes per session: Not on file  . Stress: Not on file  Relationships  . Social connections:    Talks on phone: Not on file    Gets together: Not on file    Attends religious service: Not on file    Active member of club or organization: Not on file    Attends meetings of clubs or organizations: Not on file    Relationship status: Not on file  Other Topics Concern  . Not on file  Social History Narrative  . Not on file     Family History: The patient's family history includes CVA in his mother; Peripheral vascular disease in his father.  ROS:   Please see the history of present illness.    ROS  All other systems reviewed and negative.   EKGs/Labs/Other Studies Reviewed:    The following studies were reviewed today: none  EKG:  EKG is  ordered today.  The ekg ordered today demonstrates sinus bradycardia at 45 bpm with incomplete left bundle branch block.  Recent Labs: No results found for requested labs within last 8760 hours.   Recent Lipid Panel    Component Value Date/Time   CHOL 119 (L) 09/22/2015 0820   TRIG 119 09/22/2015 0820   HDL 39 (L) 09/22/2015 0820   CHOLHDL 3.1 09/22/2015 0820   VLDL 24 09/22/2015 0820   LDLCALC 56 09/22/2015 0820    Physical Exam:    VS:  BP 100/72   Pulse (!) 45   Ht 5\' 10"  (1.778 m)   Wt 192 lb (87.1 kg)   SpO2 97%   BMI 27.55 kg/m     Wt Readings from Last 3 Encounters:  08/22/17 192 lb (87.1 kg)  04/16/17 190 lb (86.2 kg)  09/15/15 190 lb 12.8 oz (86.5 kg)     GEN:  Well nourished, well developed in no acute distress HEENT: Normal NECK: No JVD; No carotid bruits LYMPHATICS: No lymphadenopathy CARDIAC: RRR, no murmurs, rubs, gallops RESPIRATORY:  Clear to auscultation without rales, wheezing or rhonchi  ABDOMEN: Soft, non-tender, non-distended MUSCULOSKELETAL:  No edema; No deformity  SKIN: Warm and dry NEUROLOGIC:  Alert and oriented x  3 PSYCHIATRIC:  Normal affect   ASSESSMENT:    1. Coronary artery disease involving native coronary artery of native heart without angina pectoris   2. Essential hypertension  3. Dyslipidemia    PLAN:    In order of problems listed above:  1.  ASCAD -status post PCI x2 in 2003.  He denies any anginal symptoms.He will continue on aspirin 81 mg daily and statin therapy.  2.  Hypertension -blood pressures well controlled on exam today.  He will continue on amlodipine 10 mg daily and ramipril 10 mg daily.  His creatinine was 1.4 on 01/24/2017.  I will repeat a bmet today.  3.  Hyperlipidemia with LDL goal less than 70.  He will continue on atorvastatin 20 mg daily.  LDL was at goal at 70 on 01/24/2017.  I will repeat FLP and ALT.  4.  Bradycardia - his heart rate today on EKG is 45 bpm with incomplete left bundle branch block.  He is fairly asymptomatic although he says once in a while he does get tired.  I will get a 24-hour monitor to see what his average heart rate is.   Medication Adjustments/Labs and Tests Ordered: Current medicines are reviewed at length with the patient today.  Concerns regarding medicines are outlined above.  Orders Placed This Encounter  Procedures  . EKG 12-Lead   No orders of the defined types were placed in this encounter.   Signed, Fransico Him, MD  08/22/2017 9:24 AM    [facility]

## 2017-08-22 NOTE — Patient Instructions (Addendum)
Medication Instructions:  Your physician recommends that you continue on your current medications as directed. Please refer to the Current Medication list given to you today.  If you need a refill on your cardiac medications, please contact your pharmacy first.  Labwork: Today for liver function and fasting lipid   Testing/Procedures: Your physician has recommended that you wear a holter monitor. Holter monitors are medical devices that record the heart's electrical activity. Doctors most often use these monitors to diagnose arrhythmias. Arrhythmias are problems with the speed or rhythm of the heartbeat. The monitor is a small, portable device. You can wear one while you do your normal daily activities. This is usually used to diagnose what is causing palpitations/syncope (passing out).  Follow-Up: Your physician wants you to follow-up in: 1 year with Dr. Radford Pax. You will receive a reminder letter in the mail two months in advance. If you don't receive a letter, please call our office to schedule the follow-up appointment.  Any Other Special Instructions Will Be Listed Below (If Applicable).   Thank you for choosing [facility], RN  [phone]  If you need a refill on your cardiac medications before your next appointment, please call your pharmacy.

## 2017-09-05 ENCOUNTER — Ambulatory Visit (INDEPENDENT_AMBULATORY_CARE_PROVIDER_SITE_OTHER): Payer: Medicare Other

## 2017-09-05 DIAGNOSIS — R001 Bradycardia, unspecified: Secondary | ICD-10-CM | POA: Diagnosis not present

## 2017-09-16 ENCOUNTER — Telehealth: Payer: Self-pay

## 2017-09-16 DIAGNOSIS — R002 Palpitations: Secondary | ICD-10-CM

## 2017-09-16 NOTE — Telephone Encounter (Signed)
Notes recorded by Teressa Senter, RN on 09/16/2017 at 1:00 PM EDT Pt made aware of Holter monitor results and Dr. Landis Gandy recommendation for echo to assess LVF and stress test to r/o ischemia. Patient denies palpitations. Pt stated understanding and thankful for the call. Pt informed our schedulers will call him to set up appt. Echo and lexiscan ordered. Instructions reviewed with patient.   Notes recorded by Sueanne Margarita, MD on 09/15/2017 at 7:16 PM EDT Heart monitor showed average heart rate 51bom with occasional extra heart beats from top and bottom of heart which are likely benign. Please find out if patient is having any palpitations Please get a 2D echo to assess LVF and Lexiscan myoview to rule out ischemia

## 2017-09-29 ENCOUNTER — Telehealth (HOSPITAL_COMMUNITY): Payer: Self-pay | Admitting: Cardiology

## 2017-09-29 NOTE — Telephone Encounter (Signed)
User: Cherie Dark A Date/time: 09/29/17 8:46 AM  Comment: Called pt and lmsg for him to CB to r/s times for echo and myoview.Marland Kitchen office will be in staff meeting.   Context:  Outcome: Left Message  Phone number: [phone] Phone Type: Home Phone  Comm. type: Telephone Call type: Outgoing  Contact: Demetria Pore J Relation to patient: Self

## 2017-09-30 ENCOUNTER — Telehealth (HOSPITAL_COMMUNITY): Payer: Self-pay | Admitting: *Deleted

## 2017-09-30 NOTE — Telephone Encounter (Signed)
Left message on voicemail per DPR in reference to upcoming appointment scheduled on 10/03/17 with detailed instructions given per Myocardial Perfusion Study Information Sheet for the test. LM to arrive 15 minutes early, and that it is imperative to arrive on time for appointment to keep from having the test rescheduled. If you need to cancel or reschedule your appointment, please call the office within 24 hours of your appointment. Failure to do so may result in a cancellation of your appointment, and a $50 no show fee. Phone number given for call back for any questions. Kirstie Peri

## 2017-10-03 ENCOUNTER — Encounter: Payer: Self-pay | Admitting: Cardiology

## 2017-10-03 ENCOUNTER — Ambulatory Visit (HOSPITAL_BASED_OUTPATIENT_CLINIC_OR_DEPARTMENT_OTHER): Payer: Medicare Other

## 2017-10-03 ENCOUNTER — Encounter (HOSPITAL_COMMUNITY): Payer: Medicare Other

## 2017-10-03 ENCOUNTER — Other Ambulatory Visit: Payer: Self-pay

## 2017-10-03 ENCOUNTER — Other Ambulatory Visit (HOSPITAL_COMMUNITY): Payer: Medicare Other

## 2017-10-03 ENCOUNTER — Ambulatory Visit (HOSPITAL_COMMUNITY): Payer: Medicare Other | Attending: Cardiovascular Disease

## 2017-10-03 DIAGNOSIS — I2510 Atherosclerotic heart disease of native coronary artery without angina pectoris: Secondary | ICD-10-CM | POA: Diagnosis not present

## 2017-10-03 DIAGNOSIS — E785 Hyperlipidemia, unspecified: Secondary | ICD-10-CM | POA: Diagnosis not present

## 2017-10-03 DIAGNOSIS — R002 Palpitations: Secondary | ICD-10-CM

## 2017-10-03 DIAGNOSIS — I341 Nonrheumatic mitral (valve) prolapse: Secondary | ICD-10-CM | POA: Insufficient documentation

## 2017-10-03 DIAGNOSIS — I119 Hypertensive heart disease without heart failure: Secondary | ICD-10-CM | POA: Insufficient documentation

## 2017-10-03 LAB — MYOCARDIAL PERFUSION IMAGING
CHL CUP NUCLEAR SDS: 3
CHL CUP NUCLEAR SRS: 7
CHL CUP NUCLEAR SSS: 10
CSEPPHR: 67 {beats}/min
LV dias vol: 105 mL (ref 62–150)
LV sys vol: 43 mL
RATE: 0.33
Rest HR: 48 {beats}/min
TID: 0.92

## 2017-10-03 MED ORDER — TECHNETIUM TC 99M TETROFOSMIN IV KIT
10.2000 | PACK | Freq: Once | INTRAVENOUS | Status: AC | PRN
  Administered 2017-10-03: 10.2 via INTRAVENOUS
  Filled 2017-10-03: qty 11

## 2017-10-03 MED ORDER — REGADENOSON 0.4 MG/5ML IV SOLN
0.4000 mg | Freq: Once | INTRAVENOUS | Status: AC
  Administered 2017-10-03: 0.4 mg via INTRAVENOUS

## 2017-10-03 MED ORDER — TECHNETIUM TC 99M TETROFOSMIN IV KIT
30.6000 | PACK | Freq: Once | INTRAVENOUS | Status: AC | PRN
  Administered 2017-10-03: 30.6 via INTRAVENOUS
  Filled 2017-10-03: qty 31

## 2017-10-04 ENCOUNTER — Other Ambulatory Visit: Payer: Self-pay | Admitting: Cardiology

## 2017-10-06 ENCOUNTER — Telehealth: Payer: Self-pay | Admitting: Cardiology

## 2017-10-06 NOTE — Telephone Encounter (Signed)
Returned call to patient informing him of echo and stress test results. He verbalized understanding and thankful for the call

## 2017-10-06 NOTE — Telephone Encounter (Signed)
Follow Up:    Returning your call from this morning,concerning his Echo results.

## 2017-11-04 ENCOUNTER — Other Ambulatory Visit: Payer: Self-pay | Admitting: Cardiology

## 2018-06-30 ENCOUNTER — Other Ambulatory Visit: Payer: Self-pay | Admitting: Cardiology

## 2018-09-25 ENCOUNTER — Other Ambulatory Visit: Payer: Self-pay | Admitting: Cardiology

## 2019-08-29 IMAGING — NM NM MISC PROCEDURE
5 series · 30 of 30 positions shown · non-contrast
Comparison: none

[Series 1: rest · 6.51mm/px · 6 of 64 frames shown]
[frame 6/64]
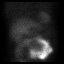
[frame 16/64]
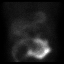
[frame 27/64]
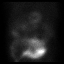
[frame 38/64]
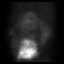
[frame 48/64]
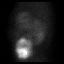
[frame 59/64]
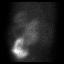

[Series 1: wbr_r-proj_st rest · 6.51mm/px · 6 of 64 frames shown]
[frame 6/64]
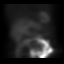
[frame 16/64]
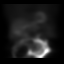
[frame 27/64]
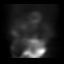
[frame 38/64]
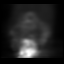
[frame 48/64]
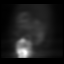
[frame 59/64]
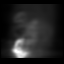

[Series 2: stress - gated · 6.51mm/px · 6 of 512 frames shown]
[frame 43/512]
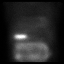
[frame 128/512]
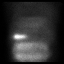
[frame 214/512]
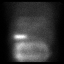
[frame 299/512]
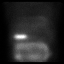
[frame 384/512]
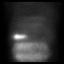
[frame 470/512]
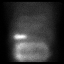

[Series 2: wbr_s-proj_st stress - gated · 6.51mm/px · 6 of 512 frames shown]
[frame 43/512]
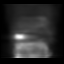
[frame 128/512]
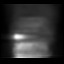
[frame 214/512]
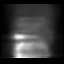
[frame 299/512]
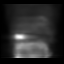
[frame 384/512]
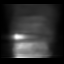
[frame 470/512]
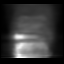

[Series 3: stress - perfusion · 6.51mm/px · 6 of 64 frames shown]
[frame 6/64]
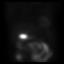
[frame 16/64]
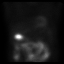
[frame 27/64]
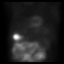
[frame 38/64]
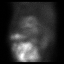
[frame 48/64]
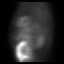
[frame 59/64]
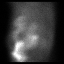

[30 of 30 positions shown; findings below may reference images not displayed]

Canned report from images found in remote index.

Refer to host system for actual result text.

## 2019-12-02 ENCOUNTER — Ambulatory Visit: Payer: Medicare Other | Admitting: Cardiology

## 2020-01-25 ENCOUNTER — Ambulatory Visit: Payer: Medicare Other | Admitting: Cardiology

## 2020-02-25 ENCOUNTER — Encounter: Payer: Self-pay | Admitting: Cardiology

## 2020-02-25 ENCOUNTER — Other Ambulatory Visit: Payer: Self-pay

## 2020-02-25 ENCOUNTER — Ambulatory Visit: Payer: Medicare Other | Admitting: Cardiology

## 2020-02-25 VITALS — BP 116/74 | HR 49 | Ht 65.0 in | Wt 187.4 lb

## 2020-02-25 DIAGNOSIS — I2510 Atherosclerotic heart disease of native coronary artery without angina pectoris: Secondary | ICD-10-CM | POA: Diagnosis not present

## 2020-02-25 DIAGNOSIS — E785 Hyperlipidemia, unspecified: Secondary | ICD-10-CM | POA: Diagnosis not present

## 2020-02-25 DIAGNOSIS — I10 Essential (primary) hypertension: Secondary | ICD-10-CM

## 2020-02-25 DIAGNOSIS — R001 Bradycardia, unspecified: Secondary | ICD-10-CM

## 2020-02-25 NOTE — Progress Notes (Signed)
Cardiology Office Note:    Date:  02/25/2020   ID:  Cory Conley, DOB 1934-02-15, MRN [id number]  PCP:  Reynold Bowen, MD  Cardiologist:  No primary care provider on file.    Referring MD: Reynold Bowen, MD   Chief Complaint  Patient presents with  . Coronary Artery Disease  . Hypertension  . Hyperlipidemia    History of Present Illness:    Cory Conley is a 86 y.o. male with a hx of ASCAD s/p PCI x 2 in 2003, dyslipdemia and HTN. He is here today for followup and is doing well.  He denies any chest pain or pressure, SOB, DOE, PND, orthopnea, LE edema, dizziness, palpitations or syncope. He is compliant with his meds and is tolerating meds with no SE.   Past Medical History:  Diagnosis Date  . Chronic kidney disease    stage 3  . Coronary artery disease    post stent X2 in 2003  . Dyslipidemia   . ED (erectile dysfunction)   . GERD (gastroesophageal reflux disease)   . History of BPH   . Hypertension   . MVP (mitral valve prolapse)    Mild MVP with trivial MR by echo 09/2017  . Peptic ulcer disease   . Pneumonia    1 year ago  . Pulmonary nodule, right    RUL 5 mm-repeat CT 03/2013    Past Surgical History:  Procedure Laterality Date  . COLONOSCOPY     50yrs 7/10  . COLONOSCOPY WITH PROPOFOL N/A 04/26/2014   Procedure: COLONOSCOPY WITH PROPOFOL;  Surgeon: Garlan Fair, MD;  Location: [facility];  Service: Endoscopy;  Laterality: N/A;  . CORONARY ANGIOPLASTY WITH STENT PLACEMENT     of LAD 05/2001, RCA 06/2001  . HIP SURGERY    . I & D EXTREMITY Right 10/25/2014   Procedure: IRRIGATION AND DEBRIDEMENT  with nailbed repair;  Surgeon: Leanora Cover, MD;  Location: [facility];  Service: Orthopedics;  Laterality: Right;  . TONSILLECTOMY      Current Medications: Current Meds  Medication Sig  . amLODipine (NORVASC) 10 MG tablet Take 10 mg by mouth every morning.   Marland Kitchen aspirin 81 MG tablet Take 1 tablet (81 mg total) by mouth daily.  Marland Kitchen atorvastatin (LIPITOR) 20 MG tablet  Take 1 tablet (20 mg total) by mouth daily. Please make annual appt with Dr. Radford Pax for future refills. Thank you  . Coenzyme Q10 (COQ10) 200 MG CAPS Take 200 mg by mouth daily.  . DUREZOL 0.05 % EMUL Place 1 drop into the left eye 3 (three) times daily.  Marland Kitchen ibuprofen (ADVIL,MOTRIN) 200 MG tablet Take 400 mg by mouth every 6 (six) hours as needed for pain or fever.   . latanoprost (XALATAN) 0.005 % ophthalmic solution Place 1 drop into both eyes daily.  . Multiple Vitamin (MULTIVITAMIN WITH MINERALS) TABS Take 1 tablet by mouth daily.  Marland Kitchen omeprazole (PRILOSEC) 20 MG capsule Take 20 mg by mouth daily.  . ramipril (ALTACE) 10 MG capsule Take 1 capsule by mouth daily. Pt takes 8 mg daily  . silodosin (RAPAFLO) 8 MG CAPS capsule Take 8 mg by mouth daily with breakfast.  . timolol (TIMOPTIC) 0.5 % ophthalmic solution Place 1 drop into both eyes 2 (two) times daily.     Allergies:   Crestor [rosuvastatin], Lipitor [atorvastatin], Niacin and related, Simvastatin, and Vytorin [ezetimibe-simvastatin]   Social History   Socioeconomic History  . Marital status: Married    Spouse name: Not  on file  . Number of children: Not on file  . Years of education: Not on file  . Highest education level: Not on file  Occupational History  . Not on file  Tobacco Use  . Smoking status: Former Smoker    Types: Cigarettes  . Smokeless tobacco: Never Used  . Tobacco comment: quit in 1984  Vaping Use  . Vaping Use: Never used  Substance and Sexual Activity  . Alcohol use: No  . Drug use: No  . Sexual activity: Not on file  Other Topics Concern  . Not on file  Social History Narrative  . Not on file   Social Determinants of Health   Financial Resource Strain:   . Difficulty of Paying Living Expenses: Not on file  Food Insecurity:   . Worried About Charity fundraiser in the Last Year: Not on file  . Ran Out of Food in the Last Year: Not on file  Transportation Needs:   . Lack of Transportation  (Medical): Not on file  . Lack of Transportation (Non-Medical): Not on file  Physical Activity:   . Days of Exercise per Week: Not on file  . Minutes of Exercise per Session: Not on file  Stress:   . Feeling of Stress : Not on file  Social Connections:   . Frequency of Communication with Friends and Family: Not on file  . Frequency of Social Gatherings with Friends and Family: Not on file  . Attends Religious Services: Not on file  . Active Member of Clubs or Organizations: Not on file  . Attends Archivist Meetings: Not on file  . Marital Status: Not on file     Family History: The patient's family history includes CVA in his mother; Peripheral vascular disease in his father.  ROS:   Please see the history of present illness.    ROS  All other systems reviewed and negative.   EKGs/Labs/Other Studies Reviewed:    The following studies were reviewed today: none  EKG:  EKG is  ordered today.  The ekg ordered today demonstrates sinus bradycardia at 49bpm with inferolateral T wave changes c/w reciprocal changes from iLBBB  Recent Labs: No results found for requested labs within last 8760 hours.   Recent Lipid Panel    Component Value Date/Time   CHOL 116 08/22/2017 0939   TRIG 79 08/22/2017 0939   HDL 39 (L) 08/22/2017 0939   CHOLHDL 3.0 08/22/2017 0939   CHOLHDL 3.1 09/22/2015 0820   VLDL 24 09/22/2015 0820   LDLCALC 61 08/22/2017 0939    Physical Exam:    VS:  BP 116/74   Pulse (!) 49   Ht 5\' 5"  (1.651 m)   Wt 187 lb 6.4 oz (85 kg)   SpO2 97%   BMI 31.18 kg/m     Wt Readings from Last 3 Encounters:  02/25/20 187 lb 6.4 oz (85 kg)  08/22/17 192 lb (87.1 kg)  04/16/17 190 lb (86.2 kg)     GEN: Well nourished, well developed in no acute distress HEENT: Normal NECK: No JVD; No carotid bruits LYMPHATICS: No lymphadenopathy CARDIAC:RRR, no murmurs, rubs, gallops RESPIRATORY:  Clear to auscultation without rales, wheezing or rhonchi  ABDOMEN: Soft,  non-tender, non-distended MUSCULOSKELETAL:  No edema; No deformity  SKIN: Warm and dry NEUROLOGIC:  Alert and oriented x 3 PSYCHIATRIC:  Normal affect    ASSESSMENT:    1. Coronary artery disease involving native coronary artery of native heart without angina  pectoris   2. Primary hypertension   3. Hyperlipidemia LDL goal <70   4. Sinus bradycardia    PLAN:    In order of problems listed above:  1.  ASCAD  -status post PCI x2 in 2003.   -he has not had any anginal symptoms -continue on aspirin 81 mg daily and statin therapy.  2.  Hypertension -BP controlled on exam today  -continue on amlodipine 10 mg daily and ramipril 10 mg daily.   -I will get a copy of BMET from PCP  3.  Hyperlipidemia  -LDL goal less than 70.   -continue on atorvastatin 20 mg daily.  -I will get a copy of labs from PCP  4.  Bradycardia  -he is asymptomatic  Medication Adjustments/Labs and Tests Ordered: Current medicines are reviewed at length with the patient today.  Concerns regarding medicines are outlined above.  Orders Placed This Encounter  Procedures  . EKG 12-Lead   No orders of the defined types were placed in this encounter.   Signed, Fransico Him, MD  02/25/2020 1:25 PM    [facility]

## 2020-02-25 NOTE — Patient Instructions (Signed)
Medication Instructions:  Your physician recommends that you continue on your current medications as directed. Please refer to the Current Medication list given to you today.  Labwork: None ordered.  Testing/Procedures: None ordered.  Follow-Up: Your physician recommends that you schedule a follow-up appointment in:   12 months with Dr. Radford Pax  Any Other Special Instructions Will Be Listed Below (If Applicable).     If you need a refill on your cardiac medications before your next appointment, please call your pharmacy.

## 2020-03-20 LAB — IFOBT (OCCULT BLOOD)

## 2021-10-02 ENCOUNTER — Other Ambulatory Visit: Payer: Self-pay

## 2021-10-02 ENCOUNTER — Emergency Department (HOSPITAL_BASED_OUTPATIENT_CLINIC_OR_DEPARTMENT_OTHER)
Admission: EM | Admit: 2021-10-02 | Discharge: 2021-10-02 | Disposition: A | Discharge status: Home / Self Care | Payer: Medicare Other | Attending: Emergency Medicine | Admitting: Emergency Medicine

## 2021-10-02 DIAGNOSIS — Z978 Presence of other specified devices: Secondary | ICD-10-CM

## 2021-10-02 DIAGNOSIS — Z7982 Long term (current) use of aspirin: Secondary | ICD-10-CM | POA: Insufficient documentation

## 2021-10-02 DIAGNOSIS — T83098A Other mechanical complication of other indwelling urethral catheter, initial encounter: Secondary | ICD-10-CM | POA: Insufficient documentation

## 2021-10-02 DIAGNOSIS — R339 Retention of urine, unspecified: Secondary | ICD-10-CM

## 2021-10-02 LAB — URINALYSIS, ROUTINE W REFLEX MICROSCOPIC
Bilirubin Urine: NEGATIVE
Glucose, UA: NEGATIVE mg/dL
Ketones, ur: NEGATIVE mg/dL
Leukocytes,Ua: NEGATIVE
Nitrite: NEGATIVE
Protein, ur: 30 mg/dL — AB
Specific Gravity, Urine: 1.016 (ref 1.005–1.030)
pH: 5.5 (ref 5.0–8.0)

## 2021-10-02 NOTE — ED Triage Notes (Signed)
Unable to urinate, hx of enlarge prostate Last void yesterday evening. Now c/o being uncomfortable and unable to go

## 2021-10-02 NOTE — Discharge Instructions (Signed)
Keep the Foley catheter in place.  Follow-up with urology to assess need for continued Foley catheter.  You will need to call to make an appointment.  Be sure to call them tomorrow to schedule a good time.  Return to the ER if you have pain fevers or any additional concerns.

## 2021-10-02 NOTE — ED Notes (Signed)
Foley site appears okay

## 2021-10-02 NOTE — ED Provider Notes (Signed)
[facility] Provider Note   CSN: [id number] Arrival date & time: 10/02/21  1703     History  Chief Complaint  Patient presents with   Urinary Retention    Cory Conley is a 88 y.o. male.  Patient presents chief complaint of inability urinate but feeling full bladder.  Symptoms ongoing for 2 days now.  Denies back pain denies fevers denies chills.  Had a similar episode sometime ago had a Foley catheter which was ultimately removed.  However he is having symptoms again worsen prior he states.       Home Medications Prior to Admission medications   Medication Sig Start Date End Date Taking? Authorizing Provider  amLODipine (NORVASC) 10 MG tablet Take 10 mg by mouth every morning.     [provider]  aspirin 81 MG tablet Take 1 tablet (81 mg total) by mouth daily. 08/31/13   Sueanne Margarita, MD  atorvastatin (LIPITOR) 20 MG tablet Take 1 tablet (20 mg total) by mouth daily. Please make annual appt with Dr. Radford Pax for future refills. Thank you 09/28/18   Sueanne Margarita, MD  Coenzyme Q10 (COQ10) 200 MG CAPS Take 200 mg by mouth daily.    [provider]  DUREZOL 0.05 % EMUL Place 1 drop into the left eye 3 (three) times daily. 07/25/17   [provider]  ibuprofen (ADVIL,MOTRIN) 200 MG tablet Take 400 mg by mouth every 6 (six) hours as needed for pain or fever.     [provider]  latanoprost (XALATAN) 0.005 % ophthalmic solution Place 1 drop into both eyes daily. 08/05/17   [provider]  Multiple Vitamin (MULTIVITAMIN WITH MINERALS) TABS Take 1 tablet by mouth daily.    [provider]  omeprazole (PRILOSEC) 20 MG capsule Take 20 mg by mouth daily.    [provider]  ramipril (ALTACE) 10 MG capsule Take 1 capsule by mouth daily. Pt takes 8 mg daily 07/28/15   [provider]  silodosin (RAPAFLO) 8 MG CAPS capsule Take 8 mg by mouth daily with breakfast.    [provider]   timolol (TIMOPTIC) 0.5 % ophthalmic solution Place 1 drop into both eyes 2 (two) times daily. 07/21/17   [provider]      Allergies    Crestor [rosuvastatin], Lipitor [atorvastatin], Niacin and related, Simvastatin, and Vytorin [ezetimibe-simvastatin]    Review of Systems   Review of Systems  Constitutional:  Negative for fever.  HENT:  Negative for ear pain and sore throat.   Eyes:  Negative for pain.  Respiratory:  Negative for cough.   Cardiovascular:  Negative for chest pain.  Gastrointestinal:  Negative for abdominal pain.  Genitourinary:  Negative for flank pain.  Musculoskeletal:  Negative for back pain.  Skin:  Negative for color change and rash.  Neurological:  Negative for syncope.  All other systems reviewed and are negative.   Physical Exam Updated Vital Signs BP 132/83 (BP Location: Right Arm)   Pulse 74   Temp 97.6 F (36.4 C)   Resp 18   Ht '5\' 11"'$  (1.803 m)   Wt 81.6 kg   SpO2 98%   BMI 25.10 kg/m  Physical Exam Constitutional:      Appearance: He is well-developed.  HENT:     Head: Normocephalic.     Nose: Nose normal.  Eyes:     Extraocular Movements: Extraocular movements intact.  Cardiovascular:     Rate and Rhythm: Normal rate.  Pulmonary:     Effort: Pulmonary effort is normal.  Abdominal:     Tenderness: There is no abdominal tenderness. There is no guarding or rebound.     Comments: Mild suprapubic discomfort on palpation.  No guarding or rebound.  Skin:    Coloration: Skin is not jaundiced.  Neurological:     Mental Status: He is alert. Mental status is at baseline.     ED Results / Procedures / Treatments   Labs (all labs ordered are listed, but only abnormal results are displayed) Labs Reviewed  URINALYSIS, ROUTINE W REFLEX MICROSCOPIC - Abnormal; Notable for the following components:      Result Value   Hgb urine dipstick SMALL (*)    Protein, ur 30 (*)    All other components within normal limits     EKG None  Radiology No results found.  Procedures Procedures    Medications Ordered in ED Medications - No data to display  ED Course/ Medical Decision Making/ A&P                           Medical Decision Making Amount and/or Complexity of Data Reviewed Labs: ordered.   Review of external records shows office visit November 2021 for coronary artery disease.  Foley catheter placed here in the ER successfully with good output of urine and subsequent resolution of abdominal discomfort.  Urinalysis unremarkable.  Foley catheter converted to a leg bag.  Patient advised outpatient follow-up with urology within the week.  Advising immediate return for fevers pain or any additional concerns.        Final Clinical Impression(s) / ED Diagnoses Final diagnoses:  Urine retention  Foley catheter in place    Rx / DC Orders ED Discharge Orders     None         Luna Fuse, MD 10/02/21 [phone]

## 2021-10-02 NOTE — ED Notes (Signed)
Pt has hx of enlarged prostate and states he has not been able to void since last night.

## 2021-10-12 ENCOUNTER — Encounter (HOSPITAL_BASED_OUTPATIENT_CLINIC_OR_DEPARTMENT_OTHER): Payer: Self-pay | Admitting: *Deleted

## 2021-10-12 ENCOUNTER — Emergency Department (HOSPITAL_BASED_OUTPATIENT_CLINIC_OR_DEPARTMENT_OTHER)
Admission: EM | Admit: 2021-10-12 | Discharge: 2021-10-12 | Disposition: A | Discharge status: Home / Self Care | Payer: Medicare Other | Attending: Emergency Medicine | Admitting: Emergency Medicine

## 2021-10-12 ENCOUNTER — Other Ambulatory Visit: Payer: Self-pay

## 2021-10-12 DIAGNOSIS — Z79899 Other long term (current) drug therapy: Secondary | ICD-10-CM | POA: Insufficient documentation

## 2021-10-12 DIAGNOSIS — Y69 Unspecified misadventure during surgical and medical care: Secondary | ICD-10-CM | POA: Insufficient documentation

## 2021-10-12 DIAGNOSIS — N3000 Acute cystitis without hematuria: Secondary | ICD-10-CM | POA: Diagnosis not present

## 2021-10-12 DIAGNOSIS — T83091A Other mechanical complication of indwelling urethral catheter, initial encounter: Secondary | ICD-10-CM | POA: Insufficient documentation

## 2021-10-12 DIAGNOSIS — Z7982 Long term (current) use of aspirin: Secondary | ICD-10-CM | POA: Diagnosis not present

## 2021-10-12 DIAGNOSIS — T839XXA Unspecified complication of genitourinary prosthetic device, implant and graft, initial encounter: Secondary | ICD-10-CM

## 2021-10-12 LAB — URINALYSIS, ROUTINE W REFLEX MICROSCOPIC
Bilirubin Urine: NEGATIVE
Glucose, UA: NEGATIVE mg/dL
Ketones, ur: NEGATIVE mg/dL
Nitrite: NEGATIVE
Protein, ur: 30 mg/dL — AB
RBC / HPF: >50 RBC/hpf — ABNORMAL HIGH (ref 0–5)
Specific Gravity, Urine: 1.016 (ref 1.005–1.030)
WBC, UA: >50 WBC/hpf — ABNORMAL HIGH (ref 0–5)
pH: 5.5 (ref 5.0–8.0)

## 2021-10-12 MED ORDER — CEPHALEXIN 250 MG PO CAPS
500.0000 mg | ORAL_CAPSULE | Freq: Once | ORAL | Status: AC
  Administered 2021-10-12: 500 mg via ORAL
  Filled 2021-10-12: qty 2

## 2021-10-12 MED ORDER — CEPHALEXIN 500 MG PO CAPS: 500.0000 mg | ORAL_CAPSULE | Freq: Two times a day (BID) | ORAL | 0 refills | Status: AC

## 2021-10-12 NOTE — ED Notes (Signed)
Pt agreeable with d/c plan as discussed by provider- this nurse has verbally reinforced d/c instructions and provided pt with written copy - pt acknowledges verbal understanding and denies any additional questions, concerns, needs  

## 2021-10-14 LAB — URINE CULTURE: Culture: >=100000 — AB

## 2021-10-15 ENCOUNTER — Telehealth: Payer: Self-pay | Admitting: Emergency Medicine

## 2021-11-13 ENCOUNTER — Other Ambulatory Visit: Payer: Self-pay | Admitting: Registered Nurse

## 2021-11-13 ENCOUNTER — Ambulatory Visit (HOSPITAL_COMMUNITY)
Admission: RE | Admit: 2021-11-13 | Discharge: 2021-11-13 | Disposition: A | Discharge status: Home / Self Care | Payer: Medicare Other | Source: Ambulatory Visit | Attending: Registered Nurse | Admitting: Registered Nurse

## 2021-11-13 ENCOUNTER — Other Ambulatory Visit (HOSPITAL_COMMUNITY): Payer: Self-pay | Admitting: Registered Nurse

## 2021-11-13 DIAGNOSIS — R531 Weakness: Secondary | ICD-10-CM | POA: Insufficient documentation

## 2021-11-13 DIAGNOSIS — N400 Enlarged prostate without lower urinary tract symptoms: Secondary | ICD-10-CM | POA: Insufficient documentation

## 2021-11-13 DIAGNOSIS — K8020 Calculus of gallbladder without cholecystitis without obstruction: Secondary | ICD-10-CM | POA: Diagnosis not present

## 2021-11-13 DIAGNOSIS — K449 Diaphragmatic hernia without obstruction or gangrene: Secondary | ICD-10-CM | POA: Diagnosis not present

## 2021-11-13 DIAGNOSIS — I2510 Atherosclerotic heart disease of native coronary artery without angina pectoris: Secondary | ICD-10-CM | POA: Diagnosis not present

## 2021-11-13 DIAGNOSIS — R748 Abnormal levels of other serum enzymes: Secondary | ICD-10-CM

## 2021-11-13 DIAGNOSIS — I700 Atherosclerosis of aorta: Secondary | ICD-10-CM | POA: Insufficient documentation

## 2021-11-13 DIAGNOSIS — D72829 Elevated white blood cell count, unspecified: Secondary | ICD-10-CM | POA: Insufficient documentation

## 2021-12-10 ENCOUNTER — Other Ambulatory Visit: Payer: Self-pay | Admitting: Ophthalmology

## 2022-04-24 ENCOUNTER — Ambulatory Visit: Payer: Medicare Other | Admitting: Internal Medicine
